# Patient Record
Sex: Female | Born: 1955 | Race: White | Hispanic: No | Smoking: Never smoker
Health system: Southern US, Community
[De-identification: ages and names within clinical notes are randomized; demographics above are authoritative.]

## PROBLEM LIST (undated history)

## (undated) DIAGNOSIS — Z923 Personal history of irradiation: Secondary | ICD-10-CM

## (undated) DIAGNOSIS — C801 Malignant (primary) neoplasm, unspecified: Secondary | ICD-10-CM

---

## 2007-06-12 ENCOUNTER — Ambulatory Visit: Payer: Self-pay | Admitting: Obstetrics and Gynecology

## 2007-06-12 ENCOUNTER — Encounter: Payer: Self-pay | Admitting: Obstetrics and Gynecology

## 2007-07-02 ENCOUNTER — Encounter: Admission: RE | Admit: 2007-07-02 | Discharge: 2007-07-02 | Payer: Self-pay | Admitting: Obstetrics and Gynecology

## 2007-09-25 HISTORY — PX: BUNIONECTOMY: SHX129

## 2010-02-02 ENCOUNTER — Ambulatory Visit: Payer: Self-pay | Admitting: Dermatology

## 2010-10-16 ENCOUNTER — Encounter: Payer: Self-pay | Admitting: Obstetrics and Gynecology

## 2011-02-06 NOTE — Group Therapy Note (Signed)
NAMEELISHEBA, MCDONNELL           ACCOUNT NO.:  [id number]   MEDICAL RECORD NO.:  [id number]          PATIENT TYPE:  WOC   LOCATION:  [facility]                   FACILITY:  [facility]   PHYSICIAN:  Argentina Donovan, MD        DATE OF BIRTH:  1956-06-06   DATE OF SERVICE:  06/12/2007                                  CLINIC NOTE   The patient is a 55 year old Caucasian female gravida 4, para 4-0-0-4 in  excellent health.   Blood pressure 108/70, on no medications with oligomenorrhea and no  complaints,  weighing 134 pounds.  In for a Pap smear and breast exam with BREASTS:  Symmetrical with no  dominant masses.  No nipple discharge.  The abdomen soft, nontender.  No  masses or organomegaly.  External genitalia is normal.  BUS is within normal limits.  Vagina is  clean and well rugated.  Cervix is clean and parous and the uterus and  adnexa are within normal limits.  RECTAL:  No masses.   IMPRESSION:  Normal gynecological examination.           ______________________________  Argentina Donovan, MD     PR/MEDQ  D:  06/12/2007  T:  06/12/2007  Job:  [id number]

## 2014-09-24 HISTORY — PX: OTHER SURGICAL HISTORY: SHX169

## 2015-10-18 ENCOUNTER — Encounter: Payer: Self-pay | Admitting: Obstetrics & Gynecology

## 2015-10-18 ENCOUNTER — Ambulatory Visit (INDEPENDENT_AMBULATORY_CARE_PROVIDER_SITE_OTHER): Payer: BLUE CROSS/BLUE SHIELD | Admitting: Obstetrics & Gynecology

## 2015-10-18 VITALS — BP 130/77 | HR 70 | Resp 20 | Ht 64.0 in | Wt 133.0 lb

## 2015-10-18 DIAGNOSIS — Z0000 Encounter for general adult medical examination without abnormal findings: Secondary | ICD-10-CM

## 2015-10-18 DIAGNOSIS — Z1151 Encounter for screening for human papillomavirus (HPV): Secondary | ICD-10-CM | POA: Diagnosis not present

## 2015-10-18 DIAGNOSIS — Z124 Encounter for screening for malignant neoplasm of cervix: Secondary | ICD-10-CM | POA: Diagnosis not present

## 2015-10-18 DIAGNOSIS — N950 Postmenopausal bleeding: Secondary | ICD-10-CM | POA: Diagnosis not present

## 2015-10-18 DIAGNOSIS — Z01419 Encounter for gynecological examination (general) (routine) without abnormal findings: Secondary | ICD-10-CM

## 2015-10-18 MED ORDER — MISOPROSTOL 200 MCG PO TABS: ORAL_TABLET | ORAL | Status: DC

## 2015-10-18 NOTE — Progress Notes (Signed)
Pt stopped having a period 3 years ago, started experiencing vaginal discharge and noticed spotting occasionally on and off.

## 2015-10-18 NOTE — Progress Notes (Signed)
Subjective:    Erin Warner is a 60 y.o. WWP 4 (15, 45, 41, and 37 yo kids, 2 grands) female who presents for an annual exam. She has been seeing some PMB for at least 3 years. She has never been on HRT. The patient is not currently sexually active. GYN screening history: last pap: was normal. The patient wears seatbelts: yes. The patient participates in regular exercise: yes. Has the patient ever been transfused or tattooed?: no. The patient reports that there is not domestic violence in her life.   Menstrual History: OB History    Gravida Para Term Preterm AB TAB SAB Ectopic Multiple Living   4 4 4       8       Menarche age: 2  No LMP recorded. Patient is postmenopausal.    The following portions of the patient's history were reviewed and updated as appropriate: allergies, current medications, past family history, past medical history, past social history, past surgical history and problem list.  Review of Systems Pertinent items noted in HPI and remainder of comprehensive ROS otherwise negative.  She works at [employer] (her son owns it). She declines a flu vaccine. She has not had a colonoscopy. Objective:    BP 130/77 mmHg  Pulse 70  Resp 20  Ht 5\' 4"  (1.626 m)  Wt 133 lb (60.328 kg)  BMI 22.82 kg/m2  General Appearance:    Alert, cooperative, no distress, appears stated age  Head:    Normocephalic, without obvious abnormality, atraumatic  Eyes:    PERRL, conjunctiva/corneas clear, EOM's intact, fundi    benign, both eyes  Ears:    Normal TM's and external ear canals, both ears  Nose:   Nares normal, septum midline, mucosa normal, no drainage    or sinus tenderness  Throat:   Lips, mucosa, and tongue normal; teeth and gums normal  Neck:   Supple, symmetrical, trachea midline, no adenopathy;    thyroid:  no enlargement/tenderness/nodules; no carotid   bruit or JVD  Back:     Symmetric, no curvature, ROM normal, no CVA tenderness  Lungs:     Clear to  auscultation bilaterally, respirations unlabored  Chest Wall:    No tenderness or deformity   Heart:    Regular rate and rhythm, S1 and S2 normal, no murmur, rub   or gallop  Breast Exam:    No tenderness, masses, or nipple abnormality  Abdomen:     Soft, non-tender, bowel sounds active all four quadrants,    no masses, no organomegaly  Genitalia:    Normal female without lesion, discharge or tenderness, atrophy, NSSR, NT, mobile, no adnexal masses     Extremities:   Extremities normal, atraumatic, no cyanosis or edema  Pulses:   2+ and symmetric all extremities  Skin:   Skin color, texture, turgor normal, no rashes or lesions  Lymph nodes:   Cervical, supraclavicular, and axillary nodes normal  Neurologic:   CNII-XII intact, normal strength, sensation and reflexes    throughout  .    Assessment:    Healthy female exam.   PMB   Plan:     Breast self exam technique reviewed and patient encouraged to perform self-exam monthly. Mammogram. Thin prep Pap smear. with cotesting Gyn u/s Refer to GI for colonoscopy

## 2015-10-19 LAB — CYTOLOGY - PAP

## 2015-10-31 ENCOUNTER — Ambulatory Visit
Admission: RE | Admit: 2015-10-31 | Discharge: 2015-10-31 | Disposition: A | Discharge status: Home / Self Care | Payer: BLUE CROSS/BLUE SHIELD | Source: Ambulatory Visit | Attending: Obstetrics & Gynecology | Admitting: Obstetrics & Gynecology

## 2015-10-31 DIAGNOSIS — Z0000 Encounter for general adult medical examination without abnormal findings: Secondary | ICD-10-CM

## 2015-10-31 DIAGNOSIS — Z1231 Encounter for screening mammogram for malignant neoplasm of breast: Secondary | ICD-10-CM | POA: Diagnosis not present

## 2015-11-01 ENCOUNTER — Encounter: Payer: Self-pay | Admitting: *Deleted

## 2015-11-01 ENCOUNTER — Other Ambulatory Visit (INDEPENDENT_AMBULATORY_CARE_PROVIDER_SITE_OTHER): Payer: BLUE CROSS/BLUE SHIELD | Admitting: Obstetrics & Gynecology

## 2015-11-01 VITALS — BP 128/80 | HR 69 | Resp 18 | Ht 64.0 in | Wt 133.0 lb

## 2015-11-01 DIAGNOSIS — Z0000 Encounter for general adult medical examination without abnormal findings: Secondary | ICD-10-CM

## 2015-11-01 DIAGNOSIS — Z01419 Encounter for gynecological examination (general) (routine) without abnormal findings: Secondary | ICD-10-CM

## 2015-11-01 DIAGNOSIS — N950 Postmenopausal bleeding: Secondary | ICD-10-CM | POA: Diagnosis not present

## 2015-11-01 LAB — CBC
HEMATOCRIT: 43 % (ref 36.0–46.0)
HEMOGLOBIN: 14.4 g/dL (ref 12.0–15.0)
MCH: 28.7 pg (ref 26.0–34.0)
MCHC: 33.5 g/dL (ref 30.0–36.0)
MCV: 85.8 fL (ref 78.0–100.0)
MPV: 9.1 fL (ref 8.6–12.4)
Platelets: 290 10*3/uL (ref 150–400)
RBC: 5.01 MIL/uL (ref 3.87–5.11)
RDW: 13.8 % (ref 11.5–15.5)
WBC: 6 10*3/uL (ref 4.0–10.5)

## 2015-11-01 NOTE — Progress Notes (Signed)
   Subjective:    Patient ID: Erin Warner, female    DOB: [birth year], 60 y.o.   MRN: [id number]  HPI 60 yo WW lady is here for endometrial biopsy for her PMB. Her u/s is scheduled for 11-03-15. She took cytotec last night. She will be getting fasting blood work today.   Review of Systems     Objective:   Physical Exam [id number]  UPT negative, consent signed, time out done Cervix prepped with betadine and grasped with a single tooth tenaculum Uterus sounded to 9 cm Pipelle used for 3 passes with a large amount of tissue obtained. She tolerated the procedure well.        Assessment & Plan:  PMB- await pathology and u/s results Preventative care- fasting labs

## 2015-11-01 NOTE — Addendum Note (Signed)
Addended by: Emily Filbert on: 11/01/2015 08:44 AM   Modules accepted: Level of Service

## 2015-11-01 NOTE — Addendum Note (Signed)
Addended by: Ricka Burdock on: 11/01/2015 08:52 AM   Modules accepted: Orders

## 2015-11-02 LAB — COMPREHENSIVE METABOLIC PANEL
ALBUMIN: 4 g/dL (ref 3.6–5.1)
ALT: 9 U/L (ref 6–29)
AST: 15 U/L (ref 10–35)
Alkaline Phosphatase: 89 U/L (ref 33–130)
BUN: 13 mg/dL (ref 7–25)
CALCIUM: 8.8 mg/dL (ref 8.6–10.4)
CHLORIDE: 106 mmol/L (ref 98–110)
CO2: 27 mmol/L (ref 20–31)
Creat: 0.91 mg/dL (ref 0.50–0.99)
Glucose, Bld: 92 mg/dL (ref 65–99)
POTASSIUM: 4.5 mmol/L (ref 3.5–5.3)
SODIUM: 142 mmol/L (ref 135–146)
TOTAL PROTEIN: 6.1 g/dL (ref 6.1–8.1)
Total Bilirubin: 0.5 mg/dL (ref 0.2–1.2)

## 2015-11-02 LAB — LIPID PANEL
CHOL/HDL RATIO: 4 ratio (ref <=5.0)
CHOLESTEROL: 206 mg/dL — AB (ref 125–200)
HDL: 52 mg/dL (ref >=46)
LDL CALC: 100 mg/dL (ref <130)
TRIGLYCERIDES: 270 mg/dL — AB (ref <150)
VLDL: 54 mg/dL — AB (ref <30)

## 2015-11-02 LAB — TSH: TSH: 0.93 mIU/L

## 2015-11-02 LAB — VITAMIN D 25 HYDROXY (VIT D DEFICIENCY, FRACTURES): Vit D, 25-Hydroxy: 15 ng/mL — ABNORMAL LOW (ref 30–100)

## 2015-11-03 ENCOUNTER — Ambulatory Visit (HOSPITAL_COMMUNITY)
Admission: RE | Admit: 2015-11-03 | Discharge: 2015-11-03 | Disposition: A | Discharge status: Home / Self Care | Payer: BLUE CROSS/BLUE SHIELD | Source: Ambulatory Visit | Attending: Obstetrics & Gynecology | Admitting: Obstetrics & Gynecology

## 2015-11-03 ENCOUNTER — Telehealth: Payer: Self-pay | Admitting: *Deleted

## 2015-11-03 ENCOUNTER — Other Ambulatory Visit: Payer: Self-pay | Admitting: Obstetrics & Gynecology

## 2015-11-03 DIAGNOSIS — Z0000 Encounter for general adult medical examination without abnormal findings: Secondary | ICD-10-CM

## 2015-11-03 DIAGNOSIS — R935 Abnormal findings on diagnostic imaging of other abdominal regions, including retroperitoneum: Secondary | ICD-10-CM | POA: Insufficient documentation

## 2015-11-03 DIAGNOSIS — N950 Postmenopausal bleeding: Secondary | ICD-10-CM | POA: Diagnosis present

## 2015-11-03 NOTE — Telephone Encounter (Signed)
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  Called report to Dr Hulan Fray, pt has [place] today, will have pt come in office tomorrow 11-04-15 to discuss results.

## 2015-11-04 ENCOUNTER — Ambulatory Visit (INDEPENDENT_AMBULATORY_CARE_PROVIDER_SITE_OTHER): Payer: BLUE CROSS/BLUE SHIELD | Admitting: Obstetrics & Gynecology

## 2015-11-04 DIAGNOSIS — C541 Malignant neoplasm of endometrium: Secondary | ICD-10-CM

## 2015-11-04 NOTE — Progress Notes (Signed)
   Subjective:    Patient ID: Erin Warner, female    DOB: 1956/03/12, 60 y.o.   MRN: [id number]  HPI She is here to discuss her EMBX   Review of Systems     Objective:   Physical Exam        Assessment & Plan:

## 2015-11-08 ENCOUNTER — Telehealth: Payer: Self-pay | Admitting: *Deleted

## 2015-11-08 DIAGNOSIS — R7989 Other specified abnormal findings of blood chemistry: Secondary | ICD-10-CM

## 2015-11-08 MED ORDER — VITAMIN D (ERGOCALCIFEROL) 1.25 MG (50000 UNIT) PO CAPS: 50000.0000 [IU] | ORAL_CAPSULE | ORAL | Status: DC

## 2015-11-08 NOTE — Telephone Encounter (Signed)
-----   Message from Emily Filbert, MD sent at 11/04/2015 11:04 AM EST ----- Vit D level is low She will need 50,000 Vit D weekly for 6 weeks, then re check

## 2015-11-09 ENCOUNTER — Telehealth: Payer: Self-pay | Admitting: *Deleted

## 2015-11-09 DIAGNOSIS — C541 Malignant neoplasm of endometrium: Secondary | ICD-10-CM

## 2015-11-09 NOTE — Telephone Encounter (Signed)
Received call from Radiology to edit CT order to CT abd/pelvis with contrast only. New order placed.

## 2015-11-10 ENCOUNTER — Ambulatory Visit (HOSPITAL_COMMUNITY)
Admission: RE | Admit: 2015-11-10 | Discharge: 2015-11-10 | Disposition: A | Discharge status: Home / Self Care | Payer: BLUE CROSS/BLUE SHIELD | Source: Ambulatory Visit | Attending: Obstetrics & Gynecology | Admitting: Obstetrics & Gynecology

## 2015-11-10 DIAGNOSIS — K769 Liver disease, unspecified: Secondary | ICD-10-CM | POA: Insufficient documentation

## 2015-11-10 DIAGNOSIS — C541 Malignant neoplasm of endometrium: Secondary | ICD-10-CM

## 2015-11-10 DIAGNOSIS — R918 Other nonspecific abnormal finding of lung field: Secondary | ICD-10-CM | POA: Diagnosis not present

## 2015-11-10 MED ORDER — IOHEXOL 300 MG/ML  SOLN
100.0000 mL | Freq: Once | INTRAMUSCULAR | Status: AC | PRN
  Administered 2015-11-10: 100 mL via INTRAVENOUS

## 2015-11-18 ENCOUNTER — Encounter: Payer: Self-pay | Admitting: Gynecology

## 2015-11-18 ENCOUNTER — Ambulatory Visit: Payer: BLUE CROSS/BLUE SHIELD | Attending: Gynecology | Admitting: Gynecology

## 2015-11-18 VITALS — BP 119/71 | HR 73 | Temp 98.0°F | Resp 18 | Ht 64.0 in | Wt 127.2 lb

## 2015-11-18 DIAGNOSIS — C541 Malignant neoplasm of endometrium: Secondary | ICD-10-CM | POA: Insufficient documentation

## 2015-11-18 NOTE — Patient Instructions (Signed)
Preparing for your Surgery  Plan for surgery on March 9 with Dr. [name].  You will be scheduled for a robotic assisted total hysterectomy, bilateral salpingo-oophorectomy, and sentinel lymph node biopsy.  Pre-operative Testing -You will receive a phone call from presurgical testing at [facility] to arrange for a pre-operative testing appointment before your surgery.  This appointment normally occurs one to two weeks before your scheduled surgery.   -Bring your insurance card, copy of an advanced directive if applicable, medication list  -At that visit, you will be asked to sign a consent for a possible blood transfusion in case a transfusion becomes necessary during surgery.  The need for a blood transfusion is rare but having consent is a necessary part of your care.     -You should not be taking blood thinners or aspirin at least ten days prior to surgery unless instructed by your surgeon.  Day Before Surgery at [facility] will be asked to take in only clear liquids the day before surgery.  Examples of clear liquids include broths, jello, and clear juices.  Avoid carbonated beverages.  You will be advised to have nothing to eat or drink after midnight the evening before.    Your role in recovery Your role is to become active as soon as directed by your doctor, while still giving yourself time to heal.  Rest when you feel tired. You will be asked to do the following in order to speed your recovery:  - Cough and breathe deeply. This helps toclear and expand your lungs and can prevent pneumonia. You may be given a spirometer to practice deep breathing. A staff member will show you how to use the spirometer. - Do mild physical activity. Walking or moving your legs help your circulation and body functions return to normal. A staff member will help you when you try to walk and will provide you with simple exercises. Do not try to get up or walk alone the first time. -  Actively manage your pain. Managing your pain lets you move in comfort. We will ask you to rate your pain on a scale of zero to 10. It is your responsibility to tell your doctor or nurse where and how much you hurt so your pain can be treated.  Special Considerations -If you are diabetic, you may be placed on insulin after surgery to have closer control over your blood sugars to promote healing and recovery.  This does not mean that you will be discharged on insulin.  If applicable, your oral antidiabetics will be resumed when you are tolerating a solid diet.  -Your final pathology results from surgery should be available by the Friday after surgery and the results will be relayed to you when available.  Blood Transfusion Information WHAT IS A BLOOD TRANSFUSION? A transfusion is the replacement of blood or some of its parts. Blood is made up of multiple cells which provide different functions.  Red blood cells carry oxygen and are used for blood loss replacement.  White blood cells fight against infection.  Platelets control bleeding.  Plasma helps clot blood.  Other blood products are available for specialized needs, such as hemophilia or other clotting disorders. BEFORE THE TRANSFUSION  Who gives blood for transfusions?   You may be able to donate blood to be used at a later date on yourself (autologous donation).  Relatives can be asked to donate blood. This is generally not any safer than if you have received blood  from a stranger. The same precautions are taken to ensure safety when a relative's blood is donated.  Healthy volunteers who are fully evaluated to make sure their blood is safe. This is blood bank blood. Transfusion therapy is the safest it has ever been in the practice of medicine. Before blood is taken from a donor, a complete history is taken to make sure that person has no history of diseases nor engages in risky social behavior (examples are intravenous drug use or  sexual activity with multiple partners). The donor's travel history is screened to minimize risk of transmitting infections, such as malaria. The donated blood is tested for signs of infectious diseases, such as HIV and hepatitis. The blood is then tested to be sure it is compatible with you in order to minimize the chance of a transfusion reaction. If you or a relative donates blood, this is often done in anticipation of surgery and is not appropriate for emergency situations. It takes many days to process the donated blood. RISKS AND COMPLICATIONS Although transfusion therapy is very safe and saves many lives, the main dangers of transfusion include:   Getting an infectious disease.  Developing a transfusion reaction. This is an allergic reaction to something in the blood you were given. Every precaution is taken to prevent this. The decision to have a blood transfusion has been considered carefully by your caregiver before blood is given. Blood is not given unless the benefits outweigh the risks.

## 2015-11-18 NOTE — Progress Notes (Signed)
Consult Note: Gyn-Onc   Erin Warner 60 y.o. female  Chief Complaint  Patient presents with  . 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  Plan:s the primary surgeon.I recommend that the patient undergo robotically assisted hysterectomy bilateral salpingo-oophorectomy with planned intraoperative frozen section to assess tumor size, depth of invasion and grade. Pelvic and aortic lymphadenectomy may be performed should high-risk features be identified.. V Surgical procedure, risks and benefits were reviewed with the patient and her daughter. Potential complications including hemorrhage infection, injury to adjacent viscera, anesthetic risks, and thromboembolic comp locations were reviewed. All questions are answered. Surgery will be scheduled at [facility] with Dr. [name] as the primary surgeon. 12/01/2015.     HPI:60 year old white female seen in consultation request of Dr. Hulan Fray regarding management of a newly diagnosed endometrial carcinoma. The patient presented with several months of abnormal postmenopausal bleeding. Ultrasound showed a thickened endometrium the patient subsequent underwent an endometrial biopsy that showed a grade 1 adenocarcinoma with squamous differentiation. CT scan of the abdomen and pelvis shows liver cysts and no other adenopathy or any evidence of advanced disease. She denies any other pelvic pain pressure or any GI or GU symptoms.  She denies any family history of gynecologic malignancies. She is up-to-date with mammograms.  Review of Systems:10 point review of systems is negative except as noted in interval history.   Vitals: Blood pressure 119/71, pulse 73, temperature 98 F (36.7 C), temperature source Oral, resp. rate 18, height 5\' 4"  (1.626 m), weight 127 lb 3.2 oz (57.698 kg), SpO2 97 %.  Physical Exam: General : The patient is a healthy woman in no acute distress.  HEENT:  normocephalic, extraoccular movements normal; neck is supple without thyromegally  Lynphnodes: Supraclavicular and inguinal nodes not enlarged  Abdomen: Soft, non-tender, no ascites, no organomegally, no masses, no hernias  Pelvic:  EGBUS: Normal female  Vagina: Normal, no lesions  Urethra and Bladder: Normal, non-tender  Cervix: Normal although she does have some prolapse.  Uterus: Normal shape size and consistency. Bi-manual examination: Non-tender; no adenxal masses or nodularity  Rectal: normal sphincter tone, no masses, no blood  Lower extremities: No edema or varicosities. Normal range of motion      No Known Allergies  History reviewed. No pertinent past medical history.  Past Surgical History  Procedure Laterality Date  . Bunionectomy  2009  . Basal cell removal  2016    Current Outpatient Prescriptions  Medication Sig Dispense Refill  . Multiple Vitamin (MULTIVITAMIN) tablet Take 1 tablet by mouth daily.    . Vitamin D, Ergocalciferol, (DRISDOL) 50000 units CAPS capsule Take 1 capsule (50,000 Units total) by mouth every 7 (seven) days. For 6 weeks. 6 capsule 0  . acetaminophen (TYLENOL) 500 MG tablet Take 500 mg by mouth every 6 (six) hours as needed. Reported on 11/18/2015     No current facility-administered medications for this visit.    Social History   Social History  . Marital Status: Widowed    Spouse Name: N/A  . Number of Children: N/A  . Years of Education: N/A   Occupational History  . Not on file.   Social History Main Topics  . Smoking status: Never Smoker   . Smokeless tobacco: Not on file  . Alcohol Use: No  . Drug Use: No  . Sexual Activity: Not Currently    Birth Control/ Protection: Post-menopausal   Other Topics Concern  .  Not on file   Social History Narrative    Family History  Problem Relation Age of Onset  . Cancer Mother 47    Lung  . Heart disease Father       Alvino Chapel, MD 11/18/2015, 2:13  PM

## 2015-11-28 ENCOUNTER — Encounter (HOSPITAL_COMMUNITY): Payer: Self-pay

## 2015-11-28 ENCOUNTER — Ambulatory Visit (HOSPITAL_COMMUNITY)
Admission: RE | Admit: 2015-11-28 | Discharge: 2015-11-28 | Disposition: A | Discharge status: Home / Self Care | Payer: BLUE CROSS/BLUE SHIELD | Source: Ambulatory Visit | Attending: Gynecologic Oncology | Admitting: Gynecologic Oncology

## 2015-11-28 ENCOUNTER — Encounter (HOSPITAL_COMMUNITY)
Admission: RE | Admit: 2015-11-28 | Discharge: 2015-11-28 | Disposition: A | Discharge status: Home / Self Care | Payer: BLUE CROSS/BLUE SHIELD | Source: Ambulatory Visit | Attending: Gynecologic Oncology | Admitting: Gynecologic Oncology

## 2015-11-28 DIAGNOSIS — C541 Malignant neoplasm of endometrium: Secondary | ICD-10-CM

## 2015-11-28 HISTORY — DX: Malignant (primary) neoplasm, unspecified: C80.1

## 2015-11-28 LAB — CBC WITH DIFFERENTIAL/PLATELET
BASOS ABS: 0 10*3/uL (ref 0.0–0.1)
Basophils Relative: 1 %
EOS PCT: 3 %
Eosinophils Absolute: 0.2 10*3/uL (ref 0.0–0.7)
HCT: 42 % (ref 36.0–46.0)
HEMOGLOBIN: 14.1 g/dL (ref 12.0–15.0)
LYMPHS ABS: 1.4 10*3/uL (ref 0.7–4.0)
LYMPHS PCT: 25 %
MCH: 28.9 pg (ref 26.0–34.0)
MCHC: 33.6 g/dL (ref 30.0–36.0)
MCV: 86.1 fL (ref 78.0–100.0)
Monocytes Absolute: 0.3 10*3/uL (ref 0.1–1.0)
Monocytes Relative: 6 %
NEUTROS PCT: 65 %
Neutro Abs: 3.7 10*3/uL (ref 1.7–7.7)
PLATELETS: 310 10*3/uL (ref 150–400)
RBC: 4.88 MIL/uL (ref 3.87–5.11)
RDW: 12.8 % (ref 11.5–15.5)
WBC: 5.7 10*3/uL (ref 4.0–10.5)

## 2015-11-28 LAB — COMPREHENSIVE METABOLIC PANEL
ALK PHOS: 84 U/L (ref 38–126)
ALT: 15 U/L (ref 14–54)
AST: 20 U/L (ref 15–41)
Albumin: 4.4 g/dL (ref 3.5–5.0)
Anion gap: 7 (ref 5–15)
BUN: 17 mg/dL (ref 6–20)
CALCIUM: 9.3 mg/dL (ref 8.9–10.3)
CHLORIDE: 110 mmol/L (ref 101–111)
CO2: 29 mmol/L (ref 22–32)
CREATININE: 0.79 mg/dL (ref 0.44–1.00)
GFR calc Af Amer: >60 mL/min (ref >60)
Glucose, Bld: 108 mg/dL — ABNORMAL HIGH (ref 65–99)
Potassium: 5 mmol/L (ref 3.5–5.1)
SODIUM: 146 mmol/L — AB (ref 135–145)
Total Bilirubin: 0.8 mg/dL (ref 0.3–1.2)
Total Protein: 6.8 g/dL (ref 6.5–8.1)

## 2015-11-28 LAB — URINALYSIS, ROUTINE W REFLEX MICROSCOPIC
Bilirubin Urine: NEGATIVE
Glucose, UA: NEGATIVE mg/dL
Hgb urine dipstick: NEGATIVE
Ketones, ur: NEGATIVE mg/dL
LEUKOCYTES UA: NEGATIVE
NITRITE: NEGATIVE
PH: 6 (ref 5.0–8.0)
Protein, ur: NEGATIVE mg/dL
SPECIFIC GRAVITY, URINE: 1.023 (ref 1.005–1.030)

## 2015-11-28 NOTE — Patient Instructions (Signed)
Erin Warner  11/28/2015   Your procedure is scheduled on: 12-01-15  Report to [facility] Main  Entrance take [facility]  elevators to 3rd floor to  [place] at   11001AM.  Call this number if you have problems the morning of surgery [phone]   Remember: ONLY 1 PERSON MAY GO WITH YOU TO SHORT STAY TO GET  READY MORNING OF YOUR SURGERY.  Do not eat food or drink liquids :After Midnight. (Follow  preop instructions per office).     Take these medicines the morning of surgery with A SIP OF WATER: None. DO NOT TAKE ANY DIABETIC MEDICATIONS DAY OF YOUR SURGERY                               You may not have any metal on your body including hair pins and              piercings  Do not wear jewelry, make-up, lotions, powders or perfumes, deodorant             Do not wear nail polish.  Do not shave  48 hours prior to surgery.              Men may shave face and neck.   Do not bring valuables to the hospital. [place].  Contacts, dentures or bridgework may not be worn into surgery.  Leave suitcase in the car. After surgery it may be brought to your room.     Patients discharged the day of surgery will not be allowed to drive home.  Name and phone number of your driver: Verdis Frederickson -daughter [phone] cell  Special Instructions: N/A              Please read over the following fact sheets you were given: _____________________________________________________________________             [facility] - Preparing for Surgery Before surgery, you can play an important role.  Because skin is not sterile, your skin needs to be as free of germs as possible.  You can reduce the number of germs on your skin by washing with CHG (chlorahexidine gluconate) soap before surgery.  CHG is an antiseptic cleaner which kills germs and bonds with the skin to continue killing germs even after washing. Please DO NOT use if you have  an allergy to CHG or antibacterial soaps.  If your skin becomes reddened/irritated stop using the CHG and inform your nurse when you arrive at Short Stay. Do not shave (including legs and underarms) for at least 48 hours prior to the first CHG shower.  You may shave your face/neck. Please follow these instructions carefully:  1.  Shower with CHG Soap the night before surgery and the  morning of Surgery.  2.  If you choose to wash your hair, wash your hair first as usual with your  normal  shampoo.  3.  After you shampoo, rinse your hair and body thoroughly to remove the  shampoo.                           4.  Use CHG as you would any other liquid soap.  You can  apply chg directly  to the skin and wash                       Gently with a scrungie or clean washcloth.  5.  Apply the CHG Soap to your body ONLY FROM THE NECK DOWN.   Do not use on face/ open                           Wound or open sores. Avoid contact with eyes, ears mouth and genitals (private parts).                       Wash face,  Genitals (private parts) with your normal soap.             6.  Wash thoroughly, paying special attention to the area where your surgery  will be performed.  7.  Thoroughly rinse your body with warm water from the neck down.  8.  DO NOT shower/wash with your normal soap after using and rinsing off  the CHG Soap.                9.  Pat yourself dry with a clean towel.            10.  Wear clean pajamas.            11.  Place clean sheets on your bed the night of your first shower and do not  sleep with pets. Day of Surgery : Do not apply any lotions/deodorants the morning of surgery.  Please wear clean clothes to the hospital/surgery center.  FAILURE TO FOLLOW THESE INSTRUCTIONS MAY RESULT IN THE CANCELLATION OF YOUR SURGERY PATIENT SIGNATURE_________________________________  NURSE  SIGNATURE__________________________________  ________________________________________________________________________  WHAT IS A BLOOD TRANSFUSION? Blood Transfusion Information  A transfusion is the replacement of blood or some of its parts. Blood is made up of multiple cells which provide different functions.  Red blood cells carry oxygen and are used for blood loss replacement.  White blood cells fight against infection.  Platelets control bleeding.  Plasma helps clot blood.  Other blood products are available for specialized needs, such as hemophilia or other clotting disorders. BEFORE THE TRANSFUSION  Who gives blood for transfusions?   Healthy volunteers who are fully evaluated to make sure their blood is safe. This is blood bank blood. Transfusion therapy is the safest it has ever been in the practice of medicine. Before blood is taken from a donor, a complete history is taken to make sure that person has no history of diseases nor engages in risky social behavior (examples are intravenous drug use or sexual activity with multiple partners). The donor's travel history is screened to minimize risk of transmitting infections, such as malaria. The donated blood is tested for signs of infectious diseases, such as HIV and hepatitis. The blood is then tested to be sure it is compatible with you in order to minimize the chance of a transfusion reaction. If you or a relative donates blood, this is often done in anticipation of surgery and is not appropriate for emergency situations. It takes many days to process the donated blood. RISKS AND COMPLICATIONS Although transfusion therapy is very safe and saves many lives, the main dangers of transfusion include:  1. Getting an infectious disease. 2. Developing a transfusion reaction. This is an allergic reaction to something in the blood you were given. Every precaution  is taken to prevent this. The decision to have a blood transfusion has been  considered carefully by your caregiver before blood is given. Blood is not given unless the benefits outweigh the risks. AFTER THE TRANSFUSION  Right after receiving a blood transfusion, you will usually feel much better and more energetic. This is especially true if your red blood cells have gotten low (anemic). The transfusion raises the level of the red blood cells which carry oxygen, and this usually causes an energy increase.  The nurse administering the transfusion will monitor you carefully for complications. HOME CARE INSTRUCTIONS  No special instructions are needed after a transfusion. You may find your energy is better. Speak with your caregiver about any limitations on activity for underlying diseases you may have. SEEK MEDICAL CARE IF:   Your condition is not improving after your transfusion.  You develop redness or irritation at the intravenous (IV) site. SEEK IMMEDIATE MEDICAL CARE IF:  Any of the following symptoms occur over the next 12 hours:  Shaking chills.  You have a temperature by mouth above 102 F (38.9 C), not controlled by medicine.  Chest, back, or muscle pain.  People around you feel you are not acting correctly or are confused.  Shortness of breath or difficulty breathing.  Dizziness and fainting.  You get a rash or develop hives.  You have a decrease in urine output.  Your urine turns a dark color or changes to pink, red, or brown. Any of the following symptoms occur over the next 10 days:  You have a temperature by mouth above 102 F (38.9 C), not controlled by medicine.  Shortness of breath.  Weakness after normal activity.  The white part of the eye turns yellow (jaundice).  You have a decrease in the amount of urine or are urinating less often.  Your urine turns a dark color or changes to pink, red, or brown. Document Released: 09/07/2000 Document Revised: 12/03/2011 Document Reviewed: 04/26/2008 ExitCare Patient Information 2014  [place].  _______________________________________________________________________  Incentive Spirometer  An incentive spirometer is a tool that can help keep your lungs clear and active. This tool measures how well you are filling your lungs with each breath. Taking long deep breaths may help reverse or decrease the chance of developing breathing (pulmonary) problems (especially infection) following:  A long period of time when you are unable to move or be active. BEFORE THE PROCEDURE   If the spirometer includes an indicator to show your best effort, your nurse or respiratory therapist will set it to a desired goal.  If possible, sit up straight or lean slightly forward. Try not to slouch.  Hold the incentive spirometer in an upright position. INSTRUCTIONS FOR USE  3. Sit on the edge of your bed if possible, or sit up as far as you can in bed or on a chair. 4. Hold the incentive spirometer in an upright position. 5. Breathe out normally. 6. Place the mouthpiece in your mouth and seal your lips tightly around it. 7. Breathe in slowly and as deeply as possible, raising the piston or the ball toward the top of the column. 8. Hold your breath for 3-5 seconds or for as long as possible. Allow the piston or ball to fall to the bottom of the column. 9. Remove the mouthpiece from your mouth and breathe out normally. 10. Rest for a few seconds and repeat Steps 1 through 7 at least 10 times every 1-2 hours when you are awake. Take  your time and take a few normal breaths between deep breaths. 11. The spirometer may include an indicator to show your best effort. Use the indicator as a goal to work toward during each repetition. 12. After each set of 10 deep breaths, practice coughing to be sure your lungs are clear. If you have an incision (the cut made at the time of surgery), support your incision when coughing by placing a pillow or rolled up towels firmly against it. Once you are able to  get out of bed, walk around indoors and cough well. You may stop using the incentive spirometer when instructed by your caregiver.  RISKS AND COMPLICATIONS  Take your time so you do not get dizzy or light-headed.  If you are in pain, you may need to take or ask for pain medication before doing incentive spirometry. It is harder to take a deep breath if you are having pain. AFTER USE  Rest and breathe slowly and easily.  It can be helpful to keep track of a log of your progress. Your caregiver can provide you with a simple table to help with this. If you are using the spirometer at home, follow these instructions: [place] IF:   You are having difficultly using the spirometer.  You have trouble using the spirometer as often as instructed.  Your pain medication is not giving enough relief while using the spirometer.  You develop fever of 100.5 F (38.1 C) or higher. SEEK IMMEDIATE MEDICAL CARE IF:   You cough up bloody sputum that had not been present before.  You develop fever of 102 F (38.9 C) or greater.  You develop worsening pain at or near the incision site. MAKE SURE YOU:   Understand these instructions.  Will watch your condition.  Will get help right away if you are not doing well or get worse. Document Released: 01/21/2007 Document Revised: 12/03/2011 Document Reviewed: 03/24/2007 [facility] Patient Information 2014 [place], [place].   ________________________________________________________________________

## 2015-11-28 NOTE — Pre-Procedure Instructions (Addendum)
11-28-15 CXR done per MD order. T/S done today per Earlene Plater.

## 2015-11-29 ENCOUNTER — Other Ambulatory Visit (HOSPITAL_COMMUNITY): Payer: BLUE CROSS/BLUE SHIELD

## 2015-11-29 LAB — ABO/RH: ABO/RH(D): O POS

## 2015-12-01 ENCOUNTER — Ambulatory Visit (HOSPITAL_COMMUNITY): Payer: BLUE CROSS/BLUE SHIELD | Admitting: Certified Registered"

## 2015-12-01 ENCOUNTER — Ambulatory Visit (HOSPITAL_COMMUNITY)
Admission: RE | Admit: 2015-12-01 | Discharge: 2015-12-02 | Disposition: A | Discharge status: Home / Self Care | Payer: BLUE CROSS/BLUE SHIELD | Source: Ambulatory Visit | Attending: Gynecologic Oncology | Admitting: Gynecologic Oncology

## 2015-12-01 ENCOUNTER — Encounter (HOSPITAL_COMMUNITY)
Admission: RE | Disposition: A | Discharge status: Home / Self Care | Payer: Self-pay | Source: Ambulatory Visit | Attending: Gynecologic Oncology

## 2015-12-01 ENCOUNTER — Encounter (HOSPITAL_COMMUNITY): Payer: Self-pay | Admitting: *Deleted

## 2015-12-01 DIAGNOSIS — D271 Benign neoplasm of left ovary: Secondary | ICD-10-CM | POA: Insufficient documentation

## 2015-12-01 DIAGNOSIS — C541 Malignant neoplasm of endometrium: Secondary | ICD-10-CM | POA: Diagnosis not present

## 2015-12-01 HISTORY — PX: ROBOTIC ASSISTED TOTAL HYSTERECTOMY: SHX6085

## 2015-12-01 LAB — TYPE AND SCREEN
ABO/RH(D): O POS
Antibody Screen: NEGATIVE

## 2015-12-01 SURGERY — HYSTERECTOMY, TOTAL, ROBOT-ASSISTED
Anesthesia: General

## 2015-12-01 MED ORDER — DEXAMETHASONE SODIUM PHOSPHATE 10 MG/ML IJ SOLN
INTRAMUSCULAR | Status: DC | PRN
  Administered 2015-12-01: 5 mg via INTRAVENOUS

## 2015-12-01 MED ORDER — PROPOFOL 10 MG/ML IV BOLUS
INTRAVENOUS | Status: DC | PRN
  Administered 2015-12-01: 160 mg via INTRAVENOUS

## 2015-12-01 MED ORDER — MIDAZOLAM HCL 5 MG/5ML IJ SOLN
INTRAMUSCULAR | Status: DC | PRN
  Administered 2015-12-01: 2 mg via INTRAVENOUS

## 2015-12-01 MED ORDER — FENTANYL CITRATE (PF) 250 MCG/5ML IJ SOLN
INTRAMUSCULAR | Status: AC
  Filled 2015-12-01: qty 5

## 2015-12-01 MED ORDER — HYDROMORPHONE HCL 1 MG/ML IJ SOLN: 0.2000 mg | INTRAMUSCULAR | Status: DC | PRN

## 2015-12-01 MED ORDER — SUGAMMADEX SODIUM 200 MG/2ML IV SOLN
INTRAVENOUS | Status: DC | PRN
  Administered 2015-12-01: 125 mg via INTRAVENOUS

## 2015-12-01 MED ORDER — OXYCODONE-ACETAMINOPHEN 5-325 MG PO TABS: 1.0000 | ORAL_TABLET | ORAL | Status: DC | PRN

## 2015-12-01 MED ORDER — GABAPENTIN 600 MG PO TABS
600.0000 mg | ORAL_TABLET | Freq: Every day | ORAL | Status: DC
  Filled 2015-12-01: qty 1

## 2015-12-01 MED ORDER — HYDROMORPHONE HCL 1 MG/ML IJ SOLN
0.2500 mg | INTRAMUSCULAR | Status: DC | PRN
  Administered 2015-12-01 (×4): 0.5 mg via INTRAVENOUS

## 2015-12-01 MED ORDER — LIDOCAINE HCL (CARDIAC) 20 MG/ML IV SOLN
INTRAVENOUS | Status: AC
  Filled 2015-12-01: qty 5

## 2015-12-01 MED ORDER — HYDROMORPHONE HCL 1 MG/ML IJ SOLN
INTRAMUSCULAR | Status: AC
  Filled 2015-12-01: qty 1

## 2015-12-01 MED ORDER — IBUPROFEN 800 MG PO TABS: 800.0000 mg | ORAL_TABLET | Freq: Three times a day (TID) | ORAL | Status: DC | PRN

## 2015-12-01 MED ORDER — FENTANYL CITRATE (PF) 100 MCG/2ML IJ SOLN
INTRAMUSCULAR | Status: DC | PRN
  Administered 2015-12-01: 50 ug via INTRAVENOUS
  Administered 2015-12-01: 100 ug via INTRAVENOUS
  Administered 2015-12-01 (×2): 50 ug via INTRAVENOUS

## 2015-12-01 MED ORDER — CEFAZOLIN SODIUM-DEXTROSE 2-3 GM-% IV SOLR
2.0000 g | INTRAVENOUS | Status: AC
  Administered 2015-12-01: 2 g via INTRAVENOUS

## 2015-12-01 MED ORDER — KETOROLAC TROMETHAMINE 15 MG/ML IJ SOLN
INTRAMUSCULAR | Status: AC
  Filled 2015-12-01: qty 1

## 2015-12-01 MED ORDER — LIDOCAINE HCL (CARDIAC) 20 MG/ML IV SOLN
INTRAVENOUS | Status: DC | PRN
  Administered 2015-12-01: 60 mg via INTRAVENOUS

## 2015-12-01 MED ORDER — SUGAMMADEX SODIUM 200 MG/2ML IV SOLN
INTRAVENOUS | Status: AC
  Filled 2015-12-01: qty 2

## 2015-12-01 MED ORDER — KETOROLAC TROMETHAMINE 15 MG/ML IJ SOLN
15.0000 mg | Freq: Four times a day (QID) | INTRAMUSCULAR | Status: DC
  Administered 2015-12-01 – 2015-12-02 (×3): 15 mg via INTRAVENOUS
  Filled 2015-12-01 (×3): qty 1

## 2015-12-01 MED ORDER — PROPOFOL 10 MG/ML IV BOLUS
INTRAVENOUS | Status: AC
  Filled 2015-12-01: qty 20

## 2015-12-01 MED ORDER — STERILE WATER FOR INJECTION IJ SOLN
INTRAMUSCULAR | Status: AC
  Filled 2015-12-01: qty 10

## 2015-12-01 MED ORDER — ONDANSETRON HCL 4 MG/2ML IJ SOLN: 4.0000 mg | Freq: Four times a day (QID) | INTRAMUSCULAR | Status: DC | PRN

## 2015-12-01 MED ORDER — ENOXAPARIN SODIUM 40 MG/0.4ML ~~LOC~~ SOLN
40.0000 mg | SUBCUTANEOUS | Status: DC
  Administered 2015-12-02: 40 mg via SUBCUTANEOUS
  Filled 2015-12-01 (×2): qty 0.4

## 2015-12-01 MED ORDER — MIDAZOLAM HCL 2 MG/2ML IJ SOLN
INTRAMUSCULAR | Status: AC
  Filled 2015-12-01: qty 2

## 2015-12-01 MED ORDER — LACTATED RINGERS IV SOLN
INTRAVENOUS | Status: DC | PRN
  Administered 2015-12-01: 1000 mL

## 2015-12-01 MED ORDER — ROCURONIUM BROMIDE 100 MG/10ML IV SOLN
INTRAVENOUS | Status: DC | PRN
  Administered 2015-12-01: 50 mg via INTRAVENOUS
  Administered 2015-12-01: 20 mg via INTRAVENOUS

## 2015-12-01 MED ORDER — ONDANSETRON HCL 4 MG/2ML IJ SOLN
INTRAMUSCULAR | Status: AC
  Filled 2015-12-01: qty 4

## 2015-12-01 MED ORDER — LIP MEDEX EX OINT
TOPICAL_OINTMENT | CUTANEOUS | Status: AC
  Filled 2015-12-01: qty 7

## 2015-12-01 MED ORDER — LACTATED RINGERS IV SOLN
INTRAVENOUS | Status: DC | PRN
  Administered 2015-12-01 (×2): via INTRAVENOUS

## 2015-12-01 MED ORDER — KCL IN DEXTROSE-NACL 20-5-0.45 MEQ/L-%-% IV SOLN
INTRAVENOUS | Status: DC
  Administered 2015-12-01: 19:00:00 via INTRAVENOUS
  Filled 2015-12-01 (×2): qty 1000

## 2015-12-01 MED ORDER — SUCCINYLCHOLINE CHLORIDE 20 MG/ML IJ SOLN
INTRAMUSCULAR | Status: DC | PRN
  Administered 2015-12-01: 100 mg via INTRAVENOUS

## 2015-12-01 MED ORDER — ENOXAPARIN SODIUM 40 MG/0.4ML ~~LOC~~ SOLN
40.0000 mg | SUBCUTANEOUS | Status: AC
  Administered 2015-12-01: 40 mg via SUBCUTANEOUS
  Filled 2015-12-01: qty 0.4

## 2015-12-01 MED ORDER — DEXAMETHASONE SODIUM PHOSPHATE 10 MG/ML IJ SOLN
INTRAMUSCULAR | Status: AC
  Filled 2015-12-01: qty 1

## 2015-12-01 MED ORDER — ONDANSETRON HCL 4 MG PO TABS: 4.0000 mg | ORAL_TABLET | Freq: Four times a day (QID) | ORAL | Status: DC | PRN

## 2015-12-01 MED ORDER — PROMETHAZINE HCL 25 MG/ML IJ SOLN: 6.2500 mg | INTRAMUSCULAR | Status: DC | PRN

## 2015-12-01 MED ORDER — CEFAZOLIN SODIUM-DEXTROSE 2-3 GM-% IV SOLR
INTRAVENOUS | Status: AC
  Filled 2015-12-01: qty 50

## 2015-12-01 MED ORDER — GABAPENTIN 300 MG PO CAPS
600.0000 mg | ORAL_CAPSULE | Freq: Every day | ORAL | Status: DC
  Administered 2015-12-01: 600 mg via ORAL
  Filled 2015-12-01 (×2): qty 2

## 2015-12-01 MED ORDER — ONDANSETRON HCL 4 MG/2ML IJ SOLN
INTRAMUSCULAR | Status: DC | PRN
  Administered 2015-12-01 (×4): 2 mg via INTRAVENOUS

## 2015-12-01 MED ORDER — STERILE WATER FOR IRRIGATION IR SOLN
Status: DC | PRN
  Administered 2015-12-01: 1000 mL

## 2015-12-01 SURGICAL SUPPLY — 50 items
APPLICATOR SURGIFLO ENDO (HEMOSTASIS) IMPLANT
CHLORAPREP W/TINT 26ML (MISCELLANEOUS) ×2 IMPLANT
COVER SURGICAL LIGHT HANDLE (MISCELLANEOUS) ×2 IMPLANT
COVER TIP SHEARS 8 DVNC (MISCELLANEOUS) ×1 IMPLANT
COVER TIP SHEARS 8MM DA VINCI (MISCELLANEOUS) ×1
DRAPE ARM DVNC X/XI (DISPOSABLE) ×4 IMPLANT
DRAPE COLUMN DVNC XI (DISPOSABLE) ×1 IMPLANT
DRAPE DA VINCI XI ARM (DISPOSABLE) ×4
DRAPE DA VINCI XI COLUMN (DISPOSABLE) ×1
DRAPE SHEET LG 3/4 BI-LAMINATE (DRAPES) ×4 IMPLANT
DRAPE SURG IRRIG POUCH 19X23 (DRAPES) ×2 IMPLANT
ELECT REM PT RETURN 9FT ADLT (ELECTROSURGICAL) ×2
ELECTRODE REM PT RTRN 9FT ADLT (ELECTROSURGICAL) ×1 IMPLANT
GLOVE BIO SURGEON STRL SZ 6 (GLOVE) ×8 IMPLANT
GLOVE BIO SURGEON STRL SZ 6.5 (GLOVE) ×4 IMPLANT
GOWN STRL REUS W/ TWL LRG LVL3 (GOWN DISPOSABLE) ×2 IMPLANT
GOWN STRL REUS W/TWL LRG LVL3 (GOWN DISPOSABLE) ×2
HOLDER FOLEY CATH W/STRAP (MISCELLANEOUS) ×2 IMPLANT
KIT BASIN OR (CUSTOM PROCEDURE TRAY) ×2 IMPLANT
KIT PROCEDURE DA VINCI SI (MISCELLANEOUS) ×1
KIT PROCEDURE DVNC SI (MISCELLANEOUS) ×1 IMPLANT
LIQUID BAND (GAUZE/BANDAGES/DRESSINGS) ×2 IMPLANT
MANIPULATOR UTERINE 4.5 ZUMI (MISCELLANEOUS) ×2 IMPLANT
MARKER SKIN DUAL TIP RULER LAB (MISCELLANEOUS) ×2 IMPLANT
OBTURATOR XI 8MM BLADELESS (TROCAR) ×2 IMPLANT
OCCLUDER COLPOPNEUMO (BALLOONS) ×2 IMPLANT
PAD POSITIONING PINK XL (MISCELLANEOUS) ×2 IMPLANT
PORT ACCESS TROCAR AIRSEAL 12 (TROCAR) ×1 IMPLANT
PORT ACCESS TROCAR AIRSEAL 5M (TROCAR) ×1
POUCH ENDO CATCH II 15MM (MISCELLANEOUS) IMPLANT
POUCH SPECIMEN RETRIEVAL 10MM (ENDOMECHANICALS) IMPLANT
SEAL CANN UNIV 5-8 DVNC XI (MISCELLANEOUS) ×4 IMPLANT
SEAL XI 5MM-8MM UNIVERSAL (MISCELLANEOUS) ×4
SET TRI-LUMEN FLTR TB AIRSEAL (TUBING) ×2 IMPLANT
SET TUBE IRRIG SUCTION NO TIP (IRRIGATION / IRRIGATOR) ×2 IMPLANT
SHEET LAVH (DRAPES) ×2 IMPLANT
SOLUTION ELECTROLUBE (MISCELLANEOUS) ×2 IMPLANT
SURGIFLO W/THROMBIN 8M KIT (HEMOSTASIS) IMPLANT
SUT MNCRL AB 4-0 PS2 18 (SUTURE) ×4 IMPLANT
SUT VIC AB 0 CT1 27 (SUTURE) ×1
SUT VIC AB 0 CT1 27XBRD ANTBC (SUTURE) ×1 IMPLANT
SUT VICRYL 0 UR6 27IN ABS (SUTURE) ×2 IMPLANT
SYR 50ML LL SCALE MARK (SYRINGE) ×2 IMPLANT
TOWEL OR 17X26 10 PK STRL BLUE (TOWEL DISPOSABLE) ×2 IMPLANT
TOWEL OR NON WOVEN STRL DISP B (DISPOSABLE) ×2 IMPLANT
TRAP SPECIMEN MUCOUS 40CC (MISCELLANEOUS) IMPLANT
TRAY FOLEY W/METER SILVER 14FR (SET/KITS/TRAYS/PACK) ×2 IMPLANT
TRAY LAPAROSCOPIC (CUSTOM PROCEDURE TRAY) ×2 IMPLANT
TROCAR BLADELESS OPT 5 100 (ENDOMECHANICALS) ×2 IMPLANT
WATER STERILE IRR 1500ML POUR (IV SOLUTION) ×2 IMPLANT

## 2015-12-01 NOTE — Anesthesia Postprocedure Evaluation (Signed)
Anesthesia Post Note  Patient: Erin Warner  Procedure(s) Performed: Procedure(s) (LRB): XI ROBOTIC ASSISTED TOTAL HYSTERECTOMY, BILATERAL SALPINGECTOMY OOPHORECTOMY WITH SENTINAL LYMPH NODE BIOPSY (N/A)  Patient location during evaluation: PACU Anesthesia Type: General Level of consciousness: awake Pain management: pain level controlled Vital Signs Assessment: post-procedure vital signs reviewed and stable Respiratory status: spontaneous breathing Cardiovascular status: stable Anesthetic complications: no    Last Vitals:  Filed Vitals:   12/01/15 1038 12/01/15 1415  BP: 115/70 118/78  Pulse: 65 82  Temp: 36.7 C 36.3 C  Resp: 16 15    Last Pain:  Filed Vitals:   12/01/15 1433  PainSc: 5                  EDWARDS,Dinora Hemm

## 2015-12-01 NOTE — H&P (View-Only) (Signed)
Consult Note: Gyn-Onc   Erin Warner 60 y.o. female  Chief Complaint  Patient presents with  . 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  Plan:s the primary surgeon.I recommend that the patient undergo robotically assisted hysterectomy bilateral salpingo-oophorectomy with planned intraoperative frozen section to assess tumor size, depth of invasion and grade. Pelvic and aortic lymphadenectomy may be performed should high-risk features be identified.. V Surgical procedure, risks and benefits were reviewed with the patient and her daughter. Potential complications including hemorrhage infection, injury to adjacent viscera, anesthetic risks, and thromboembolic comp locations were reviewed. All questions are answered. Surgery will be scheduled at [facility] with Dr. [name] as the primary surgeon. 12/01/2015.     HPI:60 year old white female seen in consultation request of Dr. Hulan Fray regarding management of a newly diagnosed endometrial carcinoma. The patient presented with several months of abnormal postmenopausal bleeding. Ultrasound showed a thickened endometrium the patient subsequent underwent an endometrial biopsy that showed a grade 1 adenocarcinoma with squamous differentiation. CT scan of the abdomen and pelvis shows liver cysts and no other adenopathy or any evidence of advanced disease. She denies any other pelvic pain pressure or any GI or GU symptoms.  She denies any family history of gynecologic malignancies. She is up-to-date with mammograms.  Review of Systems:10 point review of systems is negative except as noted in interval history.   Vitals: Blood pressure 119/71, pulse 73, temperature 98 F (36.7 C), temperature source Oral, resp. rate 18, height 5\' 4"  (1.626 m), weight 127 lb 3.2 oz (57.698 kg), SpO2 97 %.  Physical Exam: General : The patient is a healthy woman in no acute distress.  HEENT:  normocephalic, extraoccular movements normal; neck is supple without thyromegally  Lynphnodes: Supraclavicular and inguinal nodes not enlarged  Abdomen: Soft, non-tender, no ascites, no organomegally, no masses, no hernias  Pelvic:  EGBUS: Normal female  Vagina: Normal, no lesions  Urethra and Bladder: Normal, non-tender  Cervix: Normal although she does have some prolapse.  Uterus: Normal shape size and consistency. Bi-manual examination: Non-tender; no adenxal masses or nodularity  Rectal: normal sphincter tone, no masses, no blood  Lower extremities: No edema or varicosities. Normal range of motion      No Known Allergies  History reviewed. No pertinent past medical history.  Past Surgical History  Procedure Laterality Date  . Bunionectomy  2009  . Basal cell removal  2016    Current Outpatient Prescriptions  Medication Sig Dispense Refill  . Multiple Vitamin (MULTIVITAMIN) tablet Take 1 tablet by mouth daily.    . Vitamin D, Ergocalciferol, (DRISDOL) 50000 units CAPS capsule Take 1 capsule (50,000 Units total) by mouth every 7 (seven) days. For 6 weeks. 6 capsule 0  . acetaminophen (TYLENOL) 500 MG tablet Take 500 mg by mouth every 6 (six) hours as needed. Reported on 11/18/2015     No current facility-administered medications for this visit.    Social History   Social History  . Marital Status: Widowed    Spouse Name: N/A  . Number of Children: N/A  . Years of Education: N/A   Occupational History  . Not on file.   Social History Main Topics  . Smoking status: Never Smoker   . Smokeless tobacco: Not on file  . Alcohol Use: No  . Drug Use: No  . Sexual Activity: Not Currently    Birth Control/ Protection: Post-menopausal   Other Topics Concern  .  Not on file   Social History Narrative    Family History  Problem Relation Age of Onset  . Cancer Mother 36    Lung  . Heart disease Father       Alvino Chapel, MD 11/18/2015, 2:13  PM

## 2015-12-01 NOTE — Op Note (Signed)
OPERATIVE NOTE 12/01/15  Surgeon: Donaciano Eva   Assistants: Dr Lahoma Crocker (an MD assistant was necessary for tissue manipulation, management of robotic instrumentation, retraction and positioning due to the complexity of the case and hospital policies).   Anesthesia: General endotracheal anesthesia  ASA Class: 3   Pre-operative Diagnosis: grade 1 endometrial cancer  Post-operative Diagnosis: same  Operation: Robotic-assisted laparoscopic total hysterectomy with bilateral salpingoophorectomy, sentinel lymph node biopsy  Surgeon: Donaciano Eva  Assistant Surgeon: Lahoma Crocker MD  Anesthesia: GET  Urine Output: 200  Operative Findings:  : 6cm uterus, normal in appearance, normal appearing ovaries. Bilateral mapping. Omental adhesions to anterior abdominal wall.  Estimated Blood Loss:  less than 50 mL      Total IV Fluids: 800 ml         Specimens: uterus, cervix, bilateral tubes and ovaries, right common iliac SLN, left obturator SLN.         Complications:  None; patient tolerated the procedure well.         Disposition: PACU - hemodynamically stable.  Procedure Details  The patient was seen in the Holding Room. The risks, benefits, complications, treatment options, and expected outcomes were discussed with the patient.  The patient concurred with the proposed plan, giving informed consent.  The site of surgery properly noted/marked. The patient was identified as Erin Warner and the procedure verified as a Robotic-assisted hysterectomy with bilateral salpingo oophorectomy and SLN biopsy. A Time Out was held and the above information confirmed.  After induction of anesthesia, the patient was draped and prepped in the usual sterile manner. Pt was placed in supine position after anesthesia and draped and prepped in the usual sterile manner. The abdominal drape was placed after the CholoraPrep had been allowed to dry for 3 minutes.  Her arms were  tucked to her side with all appropriate precautions.  The shoulders were stabilized with padded shoulder blocks applied to the acromium processes.  The patient was placed in the semi-lithotomy position in [place].  The perineum was prepped with Betadine. The patient was then prepped. Foley catheter was placed.  A sterile speculum was placed in the vagina.  The cervix was grasped with a single-tooth tenaculum and dilated with Kennon Rounds dilators.  1mg  total of ICG was injected into the cervical stroma at 2 and 9 o'clock at a 8mm depth (concentration 0..5mg /ml). The ZUMI uterine manipulator with a medium colpotomizer ring was placed without difficulty.  A pneum occluder balloon was placed over the manipulator.  OG tube placement was confirmed and to suction.   Next, a 5 mm skin incision was made 1 cm below the subcostal margin in the midclavicular line.  The 5 mm Optiview port and scope was used for direct entry.  Opening pressure was under 10 mm CO2.  The abdomen was insufflated and the findings were noted as above.   At this point and all points during the procedure, the patient's intra-abdominal pressure did not exceed 15 mmHg. Next, a 10 mm skin incision was made in the umbilicus and a right and left port was placed about 10 cm lateral to the robot port on the right and left side.  A fourth arm was placed in the left lower quadrant 2 cm above and superior and medial to the anterior superior iliac spine.  All ports were placed under direct visualization.  The patient was placed in steep Trendelenburg. The omentum was taken down from the anterior abdominal wall using sharp dissection.  Some bleeding was encountered on the omentum which was later made hemostatic using the robotic bipolar instrument with care to dissect the omentum that was bleeding off of the transverse colon. Bowel was folded away into the upper abdomen.  The robot was docked in the normal manner.  The right and left peritoneum were opened  parallel to the IP ligament to open the retroperitoneal spaces bilaterally. The SLN mapping was performed in bilateral pelvic basins. The para rectal and paravesical spaces were opened up. Lymphatic channels were identified travelling to the following visualized sentinel lymph node's: right common iliac (medial to the common iliac artery) and left obturator SLN. These SLN's were separated from their surrounding lymphatic tissue, removed and sent for permanent pathology.  The hysterectomy was started after the round ligament on the right side was incised and the retroperitoneum was entered and the pararectal space was developed.  The ureter was noted to be on the medial leaf of the broad ligament.  The peritoneum above the ureter was incised and stretched and the infundibulopelvic ligament was skeletonized, cauterized and cut.  The posterior peritoneum was taken down to the level of the KOH ring.  The anterior peritoneum was also taken down.  The bladder flap was created to the level of the KOH ring.  The uterine artery on the right side was skeletonized, cauterized and cut in the normal manner.  A similar procedure was performed on the left.  The colpotomy was made and the uterus, cervix, bilateral ovaries and tubes were amputated and delivered through the vagina.  Pedicles were inspected and excellent hemostasis was achieved.    The colpotomy at the vaginal cuff was closed with Vicryl on a CT1 needle in a running manner.  Irrigation was used and excellent hemostasis was achieved.  At this point in the procedure was completed.  Robotic instruments were removed under direct visulaization.  The robot was undocked. The 10 mm ports were closed with Vicryl on a UR-5 needle and the fascia was closed with 0 Vicryl on a UR-5 needle.  The skin was closed with 4-0 Vicryl in a subcuticular manner.  Dermabond was applied.  Sponge, lap and needle counts correct x 2.  The patient was taken to the recovery room in stable  condition.  The vagina was swabbed with  minimal bleeding noted.   All instrument and needle counts were correct x  3.   The patient was transferred to the recovery room in a stable condition.  Donaciano Eva, MD

## 2015-12-01 NOTE — Anesthesia Procedure Notes (Signed)
Procedure Name: Intubation Date/Time: 12/01/2015 12:13 PM Performed by: Freddie Breech Pre-anesthesia Checklist: Patient identified, Emergency Drugs available, Suction available, Patient being monitored and Timeout performed Patient Re-evaluated:Patient Re-evaluated prior to inductionOxygen Delivery Method: Circle system utilized Preoxygenation: Pre-oxygenation with 100% oxygen Intubation Type: IV induction Ventilation: Mask ventilation without difficulty Laryngoscope Size: Mac and 3 Grade View: Grade II Tube size: 7.0 mm Number of attempts: 1 Airway Equipment and Method: Patient positioned with wedge pillow and Stylet Placement Confirmation: ETT inserted through vocal cords under direct vision,  positive ETCO2,  CO2 detector and breath sounds checked- equal and bilateral Secured at: 21 cm Tube secured with: Tape Dental Injury: Teeth and Oropharynx as per pre-operative assessment

## 2015-12-01 NOTE — Anesthesia Preprocedure Evaluation (Addendum)
Anesthesia Evaluation  Patient identified by MRN, date of birth, ID band Patient awake  General Assessment Comment:History noted. CE  History of Anesthesia Complications (+) PONV  Airway Mallampati: II  TM Distance: >3 FB Neck ROM: Full    Dental   Pulmonary neg pulmonary ROS,    breath sounds clear to auscultation       Cardiovascular negative cardio ROS   Rhythm:Regular Rate:Normal     Neuro/Psych    GI/Hepatic negative GI ROS, Neg liver ROS,   Endo/Other  negative endocrine ROS  Renal/GU negative Renal ROS     Musculoskeletal   Abdominal   Peds  Hematology   Anesthesia Other Findings   Reproductive/Obstetrics                             Anesthesia Physical Anesthesia Plan  ASA: II  Anesthesia Plan: General   Post-op Pain Management:    Induction: Intravenous  Airway Management Planned: Oral ETT  Additional Equipment:   Intra-op Plan:   Post-operative Plan: Extubation in OR  Informed Consent: I have reviewed the patients History and Physical, chart, labs and discussed the procedure including the risks, benefits and alternatives for the proposed anesthesia with the patient or authorized representative who has indicated his/her understanding and acceptance.   Dental advisory given  Plan Discussed with: CRNA, Anesthesiologist and Surgeon  Anesthesia Plan Comments:        Anesthesia Quick Evaluation

## 2015-12-01 NOTE — Transfer of Care (Signed)
Immediate Anesthesia Transfer of Care Note  Patient: Erin Warner  Procedure(s) Performed: Procedure(s): XI ROBOTIC ASSISTED TOTAL HYSTERECTOMY, BILATERAL SALPINGECTOMY OOPHORECTOMY WITH SENTINAL LYMPH NODE BIOPSY (N/A)  Patient Location: PACU  Anesthesia Type:General  Level of Consciousness:  sedated, patient cooperative and responds to stimulation  Airway & Oxygen Therapy:Patient Spontanous Breathing and Patient connected to face mask oxgen  Post-op Assessment:  Report given to PACU RN and Post -op Vital signs reviewed and stable  Post vital signs:  Reviewed and stable  Last Vitals:  Filed Vitals:   12/01/15 1038  BP: 115/70  Pulse: 65  Temp: 36.7 C  Resp: 16    Complications: No apparent anesthesia complications

## 2015-12-01 NOTE — Interval H&P Note (Signed)
History and Physical Interval Note:  12/01/2015 11:35 AM  Erin Warner  has presented today for surgery, with the diagnosis of endometrial cancer  The various methods of treatment have been discussed with the patient and family. After consideration of risks, benefits and other options for treatment, the patient has consented to  Procedure(s): XI ROBOTIC ASSISTED TOTAL HYSTERECTOMY WITH SENTINAL LYMPH NODE BIOPSY (N/A) as a surgical intervention .  The patient's history has been reviewed, patient examined, no change in status, stable for surgery.  I have reviewed the patient's chart and labs.  Questions were answered to the patient's satisfaction.     Donaciano Eva

## 2015-12-02 DIAGNOSIS — C541 Malignant neoplasm of endometrium: Secondary | ICD-10-CM | POA: Diagnosis not present

## 2015-12-02 LAB — BASIC METABOLIC PANEL
Anion gap: 6 (ref 5–15)
BUN: 13 mg/dL (ref 6–20)
CALCIUM: 8.2 mg/dL — AB (ref 8.9–10.3)
CO2: 25 mmol/L (ref 22–32)
CREATININE: 0.73 mg/dL (ref 0.44–1.00)
Chloride: 105 mmol/L (ref 101–111)
GFR calc Af Amer: >60 mL/min (ref >60)
GLUCOSE: 118 mg/dL — AB (ref 65–99)
Potassium: 4.2 mmol/L (ref 3.5–5.1)
SODIUM: 136 mmol/L (ref 135–145)

## 2015-12-02 LAB — CBC
HCT: 36.2 % (ref 36.0–46.0)
Hemoglobin: 12.5 g/dL (ref 12.0–15.0)
MCH: 28.5 pg (ref 26.0–34.0)
MCHC: 34.5 g/dL (ref 30.0–36.0)
MCV: 82.5 fL (ref 78.0–100.0)
PLATELETS: 241 10*3/uL (ref 150–400)
RBC: 4.39 MIL/uL (ref 3.87–5.11)
RDW: 12.8 % (ref 11.5–15.5)
WBC: 6.7 10*3/uL (ref 4.0–10.5)

## 2015-12-02 MED ORDER — TRAMADOL HCL 50 MG PO TABS: 50.0000 mg | ORAL_TABLET | Freq: Four times a day (QID) | ORAL | Status: DC | PRN

## 2015-12-02 NOTE — Discharge Instructions (Signed)
12/02/2015  Return to work: 4-6 weeks if applicable  Activity: 1. Be up and out of the bed during the day.  Take a nap if needed.  You may walk up steps but be careful and use the hand rail.  Stair climbing will tire you more than you think, you may need to stop part way and rest.   2. No lifting or straining for 6 weeks.  3. No driving for 1 week(s).  Do not drive if you are taking narcotic pain medicine.  4. Shower daily.  Use soap and water on your incision and pat dry; don't rub.  No tub baths until cleared by your surgeon.   5. No sexual activity and nothing in the vagina for 6 weeks.  6. You may experience a small amount of clear drainage from your incisions, which is normal.  If the drainage persists or increases, please call the office.   Diet: 1. Low sodium Heart Healthy Diet is recommended.  2. It is safe to use a laxative, such as Miralax or Colace, if you have difficulty moving your bowels.   Wound Care: 1. Keep clean and dry.  Shower daily.  Reasons to call the Doctor:  Fever - Oral temperature greater than 100.4 degrees Fahrenheit  Foul-smelling vaginal discharge  Difficulty urinating  Nausea and vomiting  Increased pain at the site of the incision that is unrelieved with pain medicine.  Difficulty breathing with or without chest pain  New calf pain especially if only on one side  Sudden, continuing increased vaginal bleeding with or without clots.   Contacts: For questions or concerns you should contact:  Dr. [name] at [phone]  Erin John, NP at [phone]  After Hours: call [phone] and have the GYN Oncologist paged/contacted  Tramadol tablets What is this medicine? TRAMADOL (TRA ma dole) is a pain reliever. It is used to treat moderate to severe pain in adults. This medicine may be used for other purposes; ask your health care provider or pharmacist if you have questions. What should I tell my health care provider before I  take this medicine? They need to know if you have any of these conditions: -brain tumor -depression -drug abuse or addiction -head injury -if you frequently drink alcohol containing drinks -kidney disease or trouble passing urine -liver disease -lung disease, asthma, or breathing problems -seizures or epilepsy -suicidal thoughts, plans, or attempt; a previous suicide attempt by you or a family member -an unusual or allergic reaction to tramadol, codeine, other medicines, foods, dyes, or preservatives -pregnant or trying to get pregnant -breast-feeding How should I use this medicine? Take this medicine by mouth with a full glass of water. Follow the directions on the prescription label. If the medicine upsets your stomach, take it with food or milk. Do not take more medicine than you are told to take. Talk to your pediatrician regarding the use of this medicine in children. Special care may be needed. Overdosage: If you think you have taken too much of this medicine contact a poison control center or emergency room at once. NOTE: This medicine is only for you. Do not share this medicine with others. What if I miss a dose? If you miss a dose, take it as soon as you can. If it is almost time for your next dose, take only that dose. Do not take double or extra doses. What may interact with this medicine? Do not take this medicine with any of the following medications: -MAOIs like  Carbex, Eldepryl, Marplan, Nardil, and Parnate This medicine may also interact with the following medications: -alcohol or medicines that contain alcohol -antihistamines -benzodiazepines -bupropion -carbamazepine or oxcarbazepine -clozapine -cyclobenzaprine -digoxin -furazolidone -linezolid -medicines for depression, anxiety, or psychotic disturbances -medicines for migraine headache like almotriptan, eletriptan, frovatriptan, naratriptan, rizatriptan, sumatriptan, zolmitriptan -medicines for pain like  pentazocine, buprenorphine, butorphanol, meperidine, nalbuphine, and propoxyphene -medicines for sleep -muscle relaxants -naltrexone -phenobarbital -phenothiazines like perphenazine, thioridazine, chlorpromazine, mesoridazine, fluphenazine, prochlorperazine, promazine, and trifluoperazine -procarbazine -warfarin This list may not describe all possible interactions. Give your health care provider a list of all the medicines, herbs, non-prescription drugs, or dietary supplements you use. Also tell them if you smoke, drink alcohol, or use illegal drugs. Some items may interact with your medicine. What should I watch for while using this medicine? Tell your doctor or health care professional if your pain does not go away, if it gets worse, or if you have new or a different type of pain. You may develop tolerance to the medicine. Tolerance means that you will need a higher dose of the medicine for pain relief. Tolerance is normal and is expected if you take this medicine for a long time. Do not suddenly stop taking your medicine because you may develop a severe reaction. Your body becomes used to the medicine. This does NOT mean you are addicted. Addiction is a behavior related to getting and using a drug for a non-medical reason. If you have pain, you have a medical reason to take pain medicine. Your doctor will tell you how much medicine to take. If your doctor wants you to stop the medicine, the dose will be slowly lowered over time to avoid any side effects. You may get drowsy or dizzy. Do not drive, use machinery, or do anything that needs mental alertness until you know how this medicine affects you. Do not stand or sit up quickly, especially if you are an older patient. This reduces the risk of dizzy or fainting spells. Alcohol can increase or decrease the effects of this medicine. Avoid alcoholic drinks. You may have constipation. Try to have a bowel movement at least every 2 to 3 days. If you do not  have a bowel movement for 3 days, call your doctor or health care professional. Your mouth may get dry. Chewing sugarless gum or sucking hard candy, and drinking plenty of water may help. Contact your doctor if the problem does not go away or is severe. What side effects may I notice from receiving this medicine? Side effects that you should report to your doctor or health care professional as soon as possible: -allergic reactions like skin rash, itching or hives, swelling of the face, lips, or tongue -breathing difficulties, wheezing -confusion -itching -light headedness or fainting spells -redness, blistering, peeling or loosening of the skin, including inside the mouth -seizures Side effects that usually do not require medical attention (report to your doctor or health care professional if they continue or are bothersome): -constipation -dizziness -drowsiness -headache -nausea, vomiting This list may not describe all possible side effects. Call your doctor for medical advice about side effects. You may report side effects to FDA at 1-800-FDA-1088. Where should I keep my medicine? Keep out of the reach of children. This medicine may cause accidental overdose and death if it taken by other adults, children, or pets. Mix any unused medicine with a substance like cat litter or coffee grounds. Then throw the medicine away in a sealed container like a sealed bag or  a coffee can with a lid. Do not use the medicine after the expiration date. Store at room temperature between 15 and 30 degrees C (59 and 86 degrees F). NOTE: This sheet is a summary. It may not cover all possible information. If you have questions about this medicine, talk to your doctor, pharmacist, or health care provider.    2016, Elsevier/Gold Standard. (2013-11-06 15:42:09)  Abdominal Hysterectomy, Care After These instructions give you information on caring for yourself after your procedure. Your doctor may also give you  more specific instructions. Call your doctor if you have any problems or questions after your procedure.  HOME CARE It takes 4-6 weeks to recover from this surgery. Follow all of your doctor's instructions.   Only take medicines as told by your doctor.  Change your bandage as told by your doctor.  Return to your doctor to have your stitches taken out.  Take showers for 2-3 weeks. Ask your doctor when it is okay to shower.  Do not douche, use tampons, or have sex (intercourse) for at least 6 weeks or as told.  Follow your doctor's advice about exercise, lifting objects, driving, and general activities.  Get plenty of rest and sleep.  Do not lift anything heavier than a gallon of milk (about 10 pounds [4.5 kilograms]) for the first month after surgery.  Get back to your normal diet as told by your doctor.  Do not drink alcohol until your doctor says it is okay.  Take a medicine to help you poop (laxative) as told by your doctor.  Eating foods high in fiber may help you poop. Eat a lot of raw fruits and vegetables, whole grains, and beans.  Drink enough fluids to keep your pee (urine) clear or pale yellow.  Have someone help you at home for 1-2 weeks after your surgery.  Keep follow-up doctor visits as told. GET HELP IF:  You have chills or fever.  You have puffiness, redness, or pain in area of the cut (incision).  You have yellowish-white fluid (pus) coming from the cut.  You have a bad smell coming from the cut or bandage.  Your cut pulls apart.  You feel dizzy or light-headed.  You have pain or bleeding when you pee.  You keep having watery poop (diarrhea).  You keep feeling sick to your stomach (nauseous) or keep throwing up (vomiting).  You have fluid (discharge) coming from your vagina.  You have a rash.  You have a reaction to your medicine.  You need stronger pain medicine. GET HELP RIGHT AWAY IF:   You have a fever and your symptoms suddenly get  worse.  You have bad belly (abdominal) pain.  You have chest pain.  You are short of breath.  You pass out (faint).  You have pain, puffiness, or redness of your leg.  You bleed a lot from your vagina and notice clumps of tissue (clots). MAKE SURE YOU:   Understand these instructions.  Will watch your condition.  Will get help right away if you are not doing well or get worse.   This information is not intended to replace advice given to you by your health care provider. Make sure you discuss any questions you have with your health care provider.   Document Released: 06/19/2008 Document Revised: 09/15/2013 Document Reviewed: 07/03/2013 Elsevier Interactive Patient Education [employer].

## 2015-12-02 NOTE — Discharge Summary (Signed)
Physician Discharge Summary  Patient ID: Erin Warner MRN: [id number] DOB/AGE: [birth year] 60 y.o.  Admit date: 12/01/2015 Discharge date: 12/02/2015  Admission Diagnoses: Endometrial carcinoma [facility])  Discharge Diagnoses:  Principal Problem:   Endometrial carcinoma ([place]) Active Problems:   Endometrial cancer [facility])   Discharged Condition:  The patient is in good condition and stable for discharge.    Hospital Course: On 12/01/2015, the patient underwent the following: Procedure(s): XI ROBOTIC ASSISTED TOTAL HYSTERECTOMY, BILATERAL SALPINGECTOMY OOPHORECTOMY WITH SENTINAL LYMPH NODE BIOPSY.   The postoperative course was uneventful.  She was discharged to home on postoperative day 1 tolerating a regular diet, voiding, minimal pain, passing flatus.  Consults: None  Significant Diagnostic Studies: None  Treatments: surgery: see above  Discharge Exam: Blood pressure 113/59, pulse 61, temperature 97.9 F (36.6 C), temperature source Oral, resp. rate 16, height 5' 3.5" (1.613 m), weight 129 lb 4 oz (58.627 kg), SpO2 99 %. General appearance: alert, cooperative and no distress Resp: clear to auscultation bilaterally Cardio: regular rate and rhythm, S1, S2 normal, no murmur, click, rub or gallop GI: soft, non-tender; bowel sounds normal; no masses,  no organomegaly Extremities: extremities normal, atraumatic, no cyanosis or edema Incision/Wound: Lap sites to the abdomen with dermabond without erythema or drainage  Disposition: Home  Discharge Instructions    Call MD for:  difficulty breathing, headache or visual disturbances    Complete by:  As directed      Call MD for:  extreme fatigue    Complete by:  As directed      Call MD for:  hives    Complete by:  As directed      Call MD for:  persistant dizziness or light-headedness    Complete by:  As directed      Call MD for:  persistant nausea and vomiting    Complete by:  As directed      Call MD for:  redness, tenderness,  or signs of infection (pain, swelling, redness, odor or green/yellow discharge around incision site)    Complete by:  As directed      Call MD for:  severe uncontrolled pain    Complete by:  As directed      Call MD for:  temperature >100.4    Complete by:  As directed      Diet - low sodium heart healthy    Complete by:  As directed      Driving Restrictions    Complete by:  As directed   No driving for 1 week.  Do not take narcotics and drive.     Increase activity slowly    Complete by:  As directed      Lifting restrictions    Complete by:  As directed   No lifting greater than 10 lbs.     Sexual Activity Restrictions    Complete by:  As directed   No sexual activity, nothing in the vagina, for 6 weeks.            Medication List    TAKE these medications        acetaminophen 500 MG tablet  Commonly known as:  TYLENOL  Take 500 mg by mouth every 6 (six) hours as needed for headache. Reported on 11/18/2015     multivitamin tablet  Take 1 tablet by mouth daily.     traMADol 50 MG tablet  Commonly known as:  ULTRAM  Take 1-2 tablets (50-100 mg total) by mouth every 6 (  six) hours as needed for moderate pain or severe pain.     Vitamin D (Ergocalciferol) 50000 units Caps capsule  Commonly known as:  DRISDOL  Take 1 capsule (50,000 Units total) by mouth every 7 (seven) days. For 6 weeks.           Follow-up Information    Follow up with Donaciano Eva, MD On 12/26/2015.   Specialty:  Obstetrics and Gynecology   Why:  at 3:15pm at the [facility] for post-op check   Contact information:   [address] [place] [address] [phone]       Greater than thirty minutes were spend for face to face discharge instructions and discharge orders/summary in EPIC.   Signed: Shedric Fredericks DEAL 12/02/2015, 8:17 AM

## 2015-12-26 ENCOUNTER — Encounter: Payer: Self-pay | Admitting: Gynecologic Oncology

## 2015-12-26 ENCOUNTER — Ambulatory Visit: Payer: BLUE CROSS/BLUE SHIELD | Attending: Gynecologic Oncology | Admitting: Gynecologic Oncology

## 2015-12-26 VITALS — BP 107/60 | HR 66 | Temp 98.1°F | Resp 18 | Ht 63.5 in | Wt 127.3 lb

## 2015-12-26 DIAGNOSIS — Z9079 Acquired absence of other genital organ(s): Secondary | ICD-10-CM | POA: Insufficient documentation

## 2015-12-26 DIAGNOSIS — C541 Malignant neoplasm of endometrium: Secondary | ICD-10-CM | POA: Diagnosis present

## 2015-12-26 DIAGNOSIS — Z8541 Personal history of malignant neoplasm of cervix uteri: Secondary | ICD-10-CM | POA: Insufficient documentation

## 2015-12-26 DIAGNOSIS — Z90722 Acquired absence of ovaries, bilateral: Secondary | ICD-10-CM | POA: Insufficient documentation

## 2015-12-26 DIAGNOSIS — Z90710 Acquired absence of both cervix and uterus: Secondary | ICD-10-CM | POA: Insufficient documentation

## 2015-12-26 NOTE — Progress Notes (Signed)
ENDOMETRIAL CANCER FOLLOW-UP  Assessment:    60 y.o. 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   S/p robotic assisted total hysterectomy, BSO, and sentinel lymph node biopsy on 12/01/15. No LVSI, 33% myometrial invasion, negative pelvic washings and negative lymph nodes.   Plan: 1) Pathology reports reviewed today 2) Treatment counseling - Very low risk (<5%) for recurrence given age, grade, depth of myometrial invasion and LVSI status. Multidisciplinary tumor board recommendation is for routine surveillance with frequent pelvic exams and visits with annual pap smear.  We will start with visits every 6 months x 5 years, at which time she can return to annual visits.  Discussed signs and symptoms of recurrence including vaginal bleeding or discharge, leg pain or swelling and changes in bowel or bladder habits. She was given the opportunity to ask questions, which were answered to her satisfaction, and she is agreement with the above mentioned plan of care.  3)  Return to clinic in 6 months.   HPI:  Erin Warner is a 60 y.o. year old [id number] initially seen in consultation on 11/18/15 referred by Dr Hulan Fray for grade 2 endometrial cancer.  She then underwent a robotic assisted total hysterectomy, BSO, SLN biopsy on 0000000 without complications.  Her postoperative course was uncomplicated.  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  She is seen today for a postoperative check and to discuss her pathology results and treatment plan.  Since discharge from the hospital, she is feeling well.  She has improving appetite, normal bowel and bladder function, and pain controlled with minimal PO medication. She has no other complaints today.  Current Outpatient Prescriptions on File Prior to Visit  Medication Sig Dispense Refill  . acetaminophen  (TYLENOL) 500 MG tablet Take 500 mg by mouth every 6 (six) hours as needed for headache. Reported on 11/18/2015    . Multiple Vitamin (MULTIVITAMIN) tablet Take 1 tablet by mouth daily.     No current facility-administered medications on file prior to visit.   No Known Allergies Past Medical History  Diagnosis Date  . Cancer ([place])     basal cell, dx.2'17  cervical cancer- surgery planned.   Past Surgical History  Procedure Laterality Date  . Bunionectomy  2009  . Basal cell removal  2016    back  . Robotic assisted total hysterectomy N/A 12/01/2015    Procedure: XI ROBOTIC ASSISTED TOTAL HYSTERECTOMY, BILATERAL SALPINGECTOMY OOPHORECTOMY WITH SENTINAL LYMPH NODE BIOPSY;  Surgeon: [name], MD;  Location: [facility] ORS;  Service: Gynecology;  Laterality: N/A;   Family History  Problem Relation Age of Onset  . Cancer Mother 18    Lung  . Heart disease Father    Social History   Social History  . Marital Status: Widowed    Spouse Name: N/A  . Number of Children: N/A  . Years of Education: N/A   Occupational History  . Not on file.   Social History Main Topics  . Smoking status: Never Smoker   . Smokeless tobacco: Not on file  . Alcohol Use: No  . Drug Use: No  . Sexual Activity: Not Currently    Birth Control/ Protection: Post-menopausal   Other Topics Concern  . Not on file   Social History Narrative    Review of systems: Constitutional:  She has no weight gain or weight loss. She has  no fever or chills. Eyes: No blurred vision Ears, Nose, Mouth, Throat: No dizziness, headaches or changes in hearing. No mouth sores. Cardiovascular: No chest pain, palpitations or edema. Respiratory:  No shortness of breath, wheezing or cough Gastrointestinal: She has normal bowel movements without diarrhea or constipation. She denies any nausea or vomiting. She denies blood in her stool or heart burn. Genitourinary:  She denies pelvic pain, pelvic pressure or changes in her urinary  function. She has no hematuria, dysuria, or incontinence. She has no irregular vaginal bleeding or vaginal discharge Musculoskeletal: Denies muscle weakness or joint pains.  Skin:  She has no skin changes, rashes or itching Neurological:  Denies dizziness or headaches. No neuropathy, no numbness or tingling. Psychiatric:  She denies depression or anxiety. Hematologic/Lymphatic:   No easy bruising or bleeding   Physical Exam: Blood pressure 107/60, pulse 66, temperature 98.1 F (36.7 C), temperature source Oral, resp. rate 18, height 5' 3.5" (1.613 m), weight 127 lb 4.8 oz (57.743 kg), SpO2 99 %. General: Well dressed, well nourished in no apparent distress.   HEENT:  Normocephalic and atraumatic, no lesions.  Extraocular muscles intact. Sclerae anicteric. Pupils equal, round, reactive. No mouth sores or ulcers. Thyroid is normal size, not nodular, midline. Lungs:  Clear to auscultation bilaterally.  No wheezes. Cardiovascular:  Regular rate and rhythm.  No murmurs or rubs. Abdomen:  Soft, nontender, nondistended.  No palpable masses.  No hepatosplenomegaly.  No ascites. Normal bowel sounds.  No hernias.  Incisions are well healed. Genitourinary: Normal EGBUS  Vaginal cuff intact.  No bleeding or discharge.  No cul de sac fullness. Extremities: No cyanosis, clubbing or edema.  No calf tenderness or erythema. No palpable cords. Psychiatric: Mood and affect are appropriate. Neurological: Awake, alert and oriented x 3. Sensation is intact, no neuropathy.  Musculoskeletal: No pain, normal strength and range of motion.   20 minutes of direct face to face counseling time was spent with the patient. This included discussion about prognosis, therapy recommendations and postoperative side effects and are beyond the scope of routine postoperative care.  Donaciano Eva, MD

## 2015-12-26 NOTE — Patient Instructions (Signed)
Plan to follow up with Dr [name] in September , call in late July to schedule your follow up appointment. Call with any changes , questions or concerns.  Thank you!

## 2016-01-10 ENCOUNTER — Telehealth: Payer: Self-pay | Admitting: Gynecologic Oncology

## 2016-01-10 NOTE — Telephone Encounter (Signed)
Patient called reporting light pink spotting after having several strong sneezes the day before.  Advised to continue to monitor and call for any changes.  Advised the sutures at the top of the vagina start to dissolve at this time which is normal and you can see light pink spotting.  Denies fever, chills.  Advised to call for any needs.

## 2016-09-28 ENCOUNTER — Ambulatory Visit (INDEPENDENT_AMBULATORY_CARE_PROVIDER_SITE_OTHER): Payer: BLUE CROSS/BLUE SHIELD | Admitting: Obstetrics & Gynecology

## 2016-09-28 ENCOUNTER — Encounter: Payer: Self-pay | Admitting: Obstetrics & Gynecology

## 2016-09-28 DIAGNOSIS — C541 Malignant neoplasm of endometrium: Secondary | ICD-10-CM

## 2016-09-28 DIAGNOSIS — N898 Other specified noninflammatory disorders of vagina: Secondary | ICD-10-CM

## 2016-09-28 DIAGNOSIS — Z0000 Encounter for general adult medical examination without abnormal findings: Secondary | ICD-10-CM

## 2016-09-28 NOTE — Addendum Note (Signed)
Addended by: Emily Filbert on: 09/28/2016 10:37 AM   Modules accepted: Orders

## 2016-09-28 NOTE — Progress Notes (Addendum)
   Subjective:    Patient ID: Erin Warner, female    DOB: 21-Dec-1955, 60 y.o.   MRN: [id number]  HPI 61 yo [place] lady with STage 1A Grade 2 endometroid endometrial cancer. She is here for a 6 month follow up.  She is not sexually active. She denies any problems.  Review of Systems     Objective:   Physical Exam [id number] Breathing, conversing, and ambulating normally Abd- benign Bimanual exam negative 2 vaginal cuff lesions: at the right edge of the cuff there is a reddened area (resembles granulation tissue) In the middle of the vaginal cuff there is a 5-7 mm white raised area. I removed this area entirely with a Kevorkian biopsy forcep and biopsied the edge of the reddened lesion. Silver nitrate yielded hemostasis. She tolerated the procedure well.       Assessment & Plan:  H/o endometrial cancer- await pathology results  [name] came back about an hour after her biopsies with her legs shaking and some rectal pain and pressure, denies any heavy bleeding.  On exam her legs are shaking some, her biopsy sites are hemostatic. I cannot find an etiology of her legs shaking.  I have advised her of my findings and rec'd urgent care if the shaking doesn't resolve in the near future.   I will order a GI referral for a colon cancer screening.

## 2016-10-05 ENCOUNTER — Telehealth: Payer: Self-pay | Admitting: Gynecologic Oncology

## 2016-10-05 ENCOUNTER — Encounter: Payer: Self-pay | Admitting: Gastroenterology

## 2016-10-05 DIAGNOSIS — C541 Malignant neoplasm of endometrium: Secondary | ICD-10-CM

## 2016-10-05 NOTE — Telephone Encounter (Signed)
Patient called with concerns about her recent biopsy.  She states Dr. Hulan Fray performed a vaginal biopsy and she spoke with her shortly about the results but still has questions.  Answered all questions and advised her that I would touch base with Dr. Denman George to see what imaging she would like to evaluate for metastatic disease.  Follow up appt with Dr. Denman George made for Jan 19.  Advised patient that we would contact her with Dr. Serita Grit recommendations.

## 2016-10-05 NOTE — Telephone Encounter (Signed)
Returned call to patient and discussed upcoming appts.  All instructions discussed.  Advised to call for any needs or concerns.

## 2016-10-08 ENCOUNTER — Other Ambulatory Visit (HOSPITAL_BASED_OUTPATIENT_CLINIC_OR_DEPARTMENT_OTHER): Payer: BLUE CROSS/BLUE SHIELD

## 2016-10-08 DIAGNOSIS — C541 Malignant neoplasm of endometrium: Secondary | ICD-10-CM

## 2016-10-08 LAB — BASIC METABOLIC PANEL
ANION GAP: 9 meq/L (ref 3–11)
BUN: 13.7 mg/dL (ref 7.0–26.0)
CALCIUM: 9.7 mg/dL (ref 8.4–10.4)
CO2: 27 mEq/L (ref 22–29)
Chloride: 108 mEq/L (ref 98–109)
Creatinine: 0.8 mg/dL (ref 0.6–1.1)
EGFR: 83 mL/min/{1.73_m2} — AB (ref >90)
Glucose: 95 mg/dl (ref 70–140)
POTASSIUM: 4.6 meq/L (ref 3.5–5.1)
SODIUM: 144 meq/L (ref 136–145)

## 2016-10-09 ENCOUNTER — Ambulatory Visit (HOSPITAL_COMMUNITY)
Admission: RE | Admit: 2016-10-09 | Discharge: 2016-10-09 | Disposition: A | Discharge status: Home / Self Care | Payer: BLUE CROSS/BLUE SHIELD | Source: Ambulatory Visit | Attending: Gynecologic Oncology | Admitting: Gynecologic Oncology

## 2016-10-09 DIAGNOSIS — R911 Solitary pulmonary nodule: Secondary | ICD-10-CM | POA: Insufficient documentation

## 2016-10-09 DIAGNOSIS — C541 Malignant neoplasm of endometrium: Secondary | ICD-10-CM | POA: Diagnosis present

## 2016-10-09 MED ORDER — IOPAMIDOL (ISOVUE-300) INJECTION 61%
INTRAVENOUS | Status: AC
  Administered 2016-10-09: 100 mL
  Filled 2016-10-09: qty 100

## 2016-10-10 ENCOUNTER — Telehealth: Payer: Self-pay | Admitting: Gynecologic Oncology

## 2016-10-10 NOTE — Telephone Encounter (Signed)
Patient informed of CT scan results.  No concerns voiced.  Advised to call for any needs or concerns before her appt on Friday

## 2016-10-10 NOTE — Telephone Encounter (Signed)
See telephone note.

## 2016-10-11 NOTE — Progress Notes (Signed)
ENDOMETRIAL CANCER FOLLOW-UP  Assessment:    61 y.o. 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   Recurrence within a year of surgery. Very low volume disease. No evidence of distant mets on CT scan (indeterminate left upper lobe nodule on CT).  Plan: 1/ referral to radiation oncology for external beam radiation and vaginal brachytherapy as seen fit by the radiation oncologist. I discussed the 70% chance of complete response to this salvage therapy. 2/ Follow-up imaging (including chest) after completing therapy.   HPI:  Erin Warner is a 61 y.o. year old [id number] initially seen in consultation on 11/18/15 referred by Dr Hulan Fray for grade 2 endometrial cancer.  She then underwent a robotic assisted total hysterectomy, BSO, SLN biopsy on 0000000 without complications.  Her postoperative course was uncomplicated.  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 Given the low risk features on pathology, no adjuvant therapy was recommended in accordance with NCCN guidelines.  Interval Hx:  She is seen today for scheduled discussion regarding results of biopsy at Dr Alease Medina  She was seen by Dr Hulan Fray on 09/28/16 for routine follow-up of her low risk endometrial cancer. 2 5-62mm nodules were seen at the right aspect of the vaginal cuff. The areas were entirely resected (grossly) with the biopsy forcep. Final pathology confirme recurrence of endometrioid adenocarcinoma, consistent with recurrent endometrial cancer.  On 116/18 she had restaging CT of chest, abdomen, pelvis. This showed no evidence of metastatic disease in the abdomen or pelvis and no adenopathy. No discrete mass was seen in the vagina. A solitary 39mm LUL pulmonary nodule was identified in the chest, favored benign.  She has no symptoms including no  bleeding or pain.  Current Outpatient Prescriptions on File Prior to Visit  Medication Sig Dispense Refill  . acetaminophen (TYLENOL) 500 MG tablet Take 500 mg by mouth every 6 (six) hours as needed for headache. Reported on 11/18/2015    . Multiple Vitamin (MULTIVITAMIN) tablet Take 1 tablet by mouth daily.     No current facility-administered medications on file prior to visit.    No Known Allergies Past Medical History:  Diagnosis Date  . Cancer ([place])    basal cell, dx.2'17  cervical cancer- surgery planned.   Past Surgical History:  Procedure Laterality Date  . basal cell removal  2016   back  . BUNIONECTOMY  2009  . ROBOTIC ASSISTED TOTAL HYSTERECTOMY N/A 12/01/2015   Procedure: XI ROBOTIC ASSISTED TOTAL HYSTERECTOMY, BILATERAL SALPINGECTOMY OOPHORECTOMY WITH SENTINAL LYMPH NODE BIOPSY;  Surgeon: [name], MD;  Location: [facility];  Service: Gynecology;  Laterality: N/A;   Family History  Problem Relation Age of Onset  . Cancer Mother 89    Lung  . Heart disease Father    Social History   Social History  . Marital status: Widowed    Spouse name: N/A  . Number of children: N/A  . Years of education: N/A   Occupational History  . Not on file.   Social History Main Topics  . Smoking status: Never Smoker  . Smokeless tobacco: Never Used  . Alcohol use No  . Drug use: No  . Sexual activity: Not Currently    Birth control/ protection: Post-menopausal   Other Topics Concern  . Not on file   Social History Narrative  . No narrative on file  Review of systems: Constitutional:  She has no weight gain or weight loss. She has no fever or chills. Eyes: No blurred vision Ears, Nose, Mouth, Throat: No dizziness, headaches or changes in hearing. No mouth sores. Cardiovascular: No chest pain, palpitations or edema. Respiratory:  No shortness of breath, wheezing or cough Gastrointestinal: She has normal bowel movements without diarrhea or constipation. She denies any  nausea or vomiting. She denies blood in her stool or heart burn. Genitourinary:  She denies pelvic pain, pelvic pressure or changes in her urinary function. She has no hematuria, dysuria, or incontinence. She has no irregular vaginal bleeding or vaginal discharge Musculoskeletal: Denies muscle weakness or joint pains.  Skin:  She has no skin changes, rashes or itching Neurological:  Denies dizziness or headaches. No neuropathy, no numbness or tingling. Psychiatric:  She denies depression or anxiety. Hematologic/Lymphatic:   No easy bruising or bleeding   Physical Exam: There were no vitals taken for this visit. General: Well dressed, well nourished in no apparent distress.   HEENT:  Normocephalic and atraumatic, no lesions.  Extraocular muscles intact. Sclerae anicteric. Pupils equal, round, reactive. No mouth sores or ulcers. Thyroid is normal size, not nodular, midline. Lungs:  Clear to auscultation bilaterally.  No wheezes. Cardiovascular:  Regular rate and rhythm.  No murmurs or rubs. Abdomen:  Soft, nontender, nondistended.  No palpable masses.  No hepatosplenomegaly.  No ascites. Normal bowel sounds.  No hernias.  Incisions are well healed. Genitourinary: Normal EGBUS  Vaginal cuff intact.  Biopsy site in mid cuff identified but with no visible gross lesions visible or palpable including on rectovaginal exam.  Extremities: No cyanosis, clubbing or edema.  No calf tenderness or erythema. No palpable cords. Psychiatric: Mood and affect are appropriate. Neurological: Awake, alert and oriented x 3. Sensation is intact, no neuropathy.  Musculoskeletal: No pain, normal strength and range of motion.   Donaciano Eva, MD

## 2016-10-12 ENCOUNTER — Ambulatory Visit: Payer: BLUE CROSS/BLUE SHIELD | Attending: Gynecologic Oncology | Admitting: Gynecologic Oncology

## 2016-10-12 ENCOUNTER — Encounter: Payer: Self-pay | Admitting: Gynecologic Oncology

## 2016-10-12 VITALS — BP 122/68 | HR 68 | Temp 97.8°F | Resp 19 | Ht 64.0 in | Wt 129.9 lb

## 2016-10-12 DIAGNOSIS — C7982 Secondary malignant neoplasm of genital organs: Secondary | ICD-10-CM | POA: Diagnosis not present

## 2016-10-12 DIAGNOSIS — C541 Malignant neoplasm of endometrium: Secondary | ICD-10-CM | POA: Diagnosis not present

## 2016-10-12 DIAGNOSIS — Z780 Asymptomatic menopausal state: Secondary | ICD-10-CM | POA: Insufficient documentation

## 2016-10-12 DIAGNOSIS — Z79899 Other long term (current) drug therapy: Secondary | ICD-10-CM | POA: Insufficient documentation

## 2016-10-12 NOTE — Patient Instructions (Signed)
Plan to meet with Dr. Gery Pray, Radiation Oncologist, to discuss and arrange for radiation treatments.  You will follow up with Dr. Denman George after you have completed radiation.

## 2016-10-15 ENCOUNTER — Encounter: Payer: Self-pay | Admitting: Gynecologic Oncology

## 2016-10-15 NOTE — Progress Notes (Signed)
Gynecologic Oncology Multi-Disciplinary Disposition Conference Note  Date of the Conference: October 15, 2016  Patient Name: Erin Warner  Referring Provider: Dr. Hulan Fray Primary GYN Oncologist: Dr. [name]  Stage/Disposition:  Recurrent Stage IA Grade 2 endometrioid endometrial cancer.  Disposition is to external beam radiation therapy with vaginal brachytherapy.   This Multidisciplinary conference took place involving physicians from [place], [place], Radiation Oncology, Pathology, Radiology along with the Gynecologic Oncology Nurse Practitioner and RN.  Comprehensive assessment of the patient's malignancy, staging, need for surgery, chemotherapy, radiation therapy, and need for further testing were reviewed. Supportive measures, both inpatient and following discharge were also discussed. The recommended plan of care is documented. Greater than 35 minutes were spent correlating and coordinating this patient's care.

## 2016-10-15 NOTE — Progress Notes (Signed)
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 She was seen by Dr Hulan Fray on 09/28/16 for routine follow-up of her low risk endometrial cancer. 2 5-65mm nodules were seen at the right aspect of the vaginal cuff. The areas were entirely resected (grossly) with the biopsy forcep.   Biopsies revealed:   09/28/16 Diagnosis Vagina, biopsy, right edge of cuff, 9:00 o'clock - ENDOMETRIOID ADENOCARCINOMA.  Diagnosis Vagina, biopsy, middle of cuff, 12:00 o'clock - SLIGHT SQUAMOUS HYPERPLASIA ASSOCIATED WITH FIBROSIS AND MILD INFLAMMATION. - SEE MICROSCOPIC DESCRIPTION. - NO DYSPLASIA OR MALIGNANCY.  12/01/15 Diagnosis 1. Lymph node, sentinel, biopsy, right common iliac THREE BENIGN LYMPH NODES (0/3) 2. Lymph node, sentinel, biopsy, left obturator TWO BENIGN LYMPH NODES (0/2) 3. Uterus +/- tubes/ovaries, neoplastic ENDOMETRIAL ADENOCARCINOMA, FIGO GRADE 2 THE TUMOR INVADES LESS THAN ONE HALF OF THE MYOMETRIUM NO DEFINITIVE LYMPHOVASCULAR INVASION IDENTIFIED MARGINS OF RESECTION ARE NEGATIVE FOR TUMOR LEFT OVARY: MUCINOUS CYSTADENOMA RIGHT OVARY AND BILATERAL FALLOPIAN TUBES: HISTOLOGICAL UNREMARKABLE  Past/Anticipated interventions by Gyn/Onc surgery, if any: 12/01/15 - Procedure: XI ROBOTIC ASSISTED TOTAL HYSTERECTOMY, BILATERAL SALPINGECTOMY OOPHORECTOMY WITH SENTINAL LYMPH NODE BIOPSY;  Surgeon: [name], MD  Past/Anticipated interventions by medical oncology, if any: no   Weight changes, if any: no  Bowel/Bladder complaints, if any: no  Nausea/Vomiting, if any: no  Pain issues, if any:  no  SAFETY ISSUES:  Prior radiation? no  Pacemaker/ICD? no  Possible current pregnancy? no  Is the patient on methotrexate? no  Current Complaints / other details:  Recommendation is for external beam radiation and vaginal brachytherapy.  Patient is here with her  friend.  BP 130/74 (BP Location: Right Arm, Patient Position: Sitting)   Pulse 66   Temp 97.9 F (36.6 C) (Oral)   Ht 5\' 4"  (1.626 m)   Wt 127 lb 12.8 oz (58 kg)   SpO2 98%   BMI 21.94 kg/m    Wt Readings from Last 3 Encounters:  10/17/16 127 lb 12.8 oz (58 kg)  10/12/16 129 lb 14.4 oz (58.9 kg)  12/26/15 127 lb 4.8 oz (57.7 kg)

## 2016-10-17 ENCOUNTER — Ambulatory Visit
Admission: RE | Admit: 2016-10-17 | Discharge: 2016-10-17 | Disposition: A | Discharge status: Home / Self Care | Payer: BLUE CROSS/BLUE SHIELD | Source: Ambulatory Visit | Attending: Radiation Oncology | Admitting: Radiation Oncology

## 2016-10-17 ENCOUNTER — Encounter: Payer: Self-pay | Admitting: Radiation Oncology

## 2016-10-17 VITALS — BP 130/74 | HR 66 | Temp 97.9°F | Ht 64.0 in | Wt 127.8 lb

## 2016-10-17 DIAGNOSIS — C7982 Secondary malignant neoplasm of genital organs: Secondary | ICD-10-CM | POA: Diagnosis present

## 2016-10-17 DIAGNOSIS — Z510 Encounter for antineoplastic radiation therapy: Secondary | ICD-10-CM | POA: Insufficient documentation

## 2016-10-17 DIAGNOSIS — C541 Malignant neoplasm of endometrium: Secondary | ICD-10-CM | POA: Insufficient documentation

## 2016-10-17 NOTE — Progress Notes (Signed)
Please see the Nurse Progress Note in the MD Initial Consult Encounter for this patient. 

## 2016-10-17 NOTE — Progress Notes (Signed)
Radiation Oncology         [phone] ________________________________  Initial Outpatient Consultation  Name: Erin Warner MRN: [id number]  Date: 10/17/2016  DOB: 1956/06/03  CC:No PCP Per Patient  Erin Amber, MD   REFERRING PHYSICIAN: [name], MD  DIAGNOSIS: Recurrent stage IA , grade 2 endometrioid endometrial cancer  HISTORY OF PRESENT ILLNESS::Erin Warner is a 61 y.o. female who was diagnosed with grade 2 endometrial cancer on 11/18/15. She underwent a robotic assisted total hysterectomy, BSO, SLN biopsy on [id number] without complications. She was seen by Dr. Hulan Fray for follow-up on 09/28/16. Two 5-7 mm nodules were seen in the 9 o'clock and 12 o'clock region of the vaginal cuff. These areas were biopsied and pathology confirmed recurrence of endometrioid adenocarcinoma. CT scan of the chest, abdomen, and pelvis on 10/09/16 showed no evidence of metastatic disease in the abdomen or pelvis. A solitary 4 mm LUL pulmonary nodule was noted in the chest, favored benign.  The patient denies weight changes, bowel/bladder complaints, nausea/vomiting, or pain at this time.  No reports of vaginal bleeding.  PREVIOUS RADIATION THERAPY: No  PAST MEDICAL HISTORY:  has a past medical history of Cancer (Erin Warner).    PAST SURGICAL HISTORY: Past Surgical History:  Procedure Laterality Date  . basal cell removal  2016   back  . BUNIONECTOMY  2009  . ROBOTIC ASSISTED TOTAL HYSTERECTOMY N/A 12/01/2015   Procedure: XI ROBOTIC ASSISTED TOTAL HYSTERECTOMY, BILATERAL SALPINGECTOMY OOPHORECTOMY WITH SENTINAL LYMPH NODE BIOPSY;  Surgeon: Erin Amber, MD;  Location: [facility];  Service: Gynecology;  Laterality: N/A;    FAMILY HISTORY: family history includes Cancer (age of onset: 39) in her mother; Heart disease in her father.  SOCIAL HISTORY:  reports that she has never smoked. She has never used smokeless tobacco. She reports that she does not drink alcohol or use drugs.  ALLERGIES: Patient  has no known allergies.  MEDICATIONS:  Current Outpatient Prescriptions  Medication Sig Dispense Refill  . acetaminophen (TYLENOL) 500 MG tablet Take 500 mg by mouth every 6 (six) hours as needed for headache. Reported on 11/18/2015    . Multiple Vitamin (MULTIVITAMIN) tablet Take 1 tablet by mouth daily.     No current facility-administered medications for this encounter.     REVIEW OF SYSTEMS:  A 12 point review of systems is documented in the electronic medical record. This was obtained by the nursing staff. However, I reviewed this with the patient to discuss relevant findings and make appropriate changes.  Pertinent findings noted in HPI    PHYSICAL EXAM:  height is 5\' 4"  (1.626 m) and weight is 127 lb 12.8 oz (58 kg). Her oral temperature is 97.9 F (36.6 C). Her blood pressure is 130/74 and her pulse is 66. Her oxygen saturation is 98%.   General: Alert and oriented, in no acute distress HEENT: Head is normocephalic. Extraocular movements are intact. Oropharynx is clear. Neck: Neck is supple, no palpable cervical or supraclavicular lymphadenopathy. Heart: Regular in rate and rhythm with no murmurs, rubs, or gallops. Chest: Clear to auscultation bilaterally, with no rhonchi, wheezes, or rales. Abdomen: Soft, nontender, nondistended, with no rigidity or guarding. Small scars in abdominal area from laparoscopic procedure Extremities: No cyanosis or edema. Lymphatics: see Neck Exam Skin: No concerning lesions. Musculoskeletal: symmetric strength and muscle tone throughout. Neurologic: Cranial nerves II through XII are grossly intact. No obvious focalities. Speech is fluent. Coordination is intact. Psychiatric: Judgment and insight are intact. Affect is appropriate. On pelvic  examination there was no inguinal adenopathy and the external genitalia were unremarkable. A speculum exam was performed. There was some erythema to the right vaginal cuff region from likely biopsy, and there was a  small area of granular tissue in mid vaginal cuff likely from other biopsy site. No visual tumor noted. On bimanual  examination there were no pelvic masses appreciated. Rectal exam was not repeated in light of exam performed by Dr. Denman George last week.   ECOG = 1  LABORATORY DATA:  Lab Results  Component Value Date   WBC 6.7 12/02/2015   HGB 12.5 12/02/2015   HCT 36.2 12/02/2015   MCV 82.5 12/02/2015   PLT 241 12/02/2015   NEUTROABS 3.7 11/28/2015   Lab Results  Component Value Date   NA 144 10/08/2016   K 4.6 10/08/2016   CL 105 12/02/2015   CO2 27 10/08/2016   GLUCOSE 95 10/08/2016   CREATININE 0.8 10/08/2016   CALCIUM 9.7 10/08/2016      RADIOGRAPHY: Ct Chest W Contrast  Result Date: 10/10/2016 CLINICAL DATA:  61 year old female with endometrial cancer diagnosed February 2017 status post TAHBSO 12/01/2015, with biopsy-proven cuff recurrence, presenting for restaging. EXAM: CT CHEST, ABDOMEN, AND PELVIS WITH CONTRAST TECHNIQUE: Multidetector CT imaging of the chest, abdomen and pelvis was performed following the standard protocol during bolus administration of intravenous contrast. CONTRAST:  100 cc ISOVUE-300 IOPAMIDOL (ISOVUE-300) INJECTION 61% COMPARISON:  11/10/2015 CT abdomen/ pelvis. 11/28/2015 chest radiograph. FINDINGS: CT CHEST FINDINGS Cardiovascular: Normal heart size. No significant pericardial fluid/thickening. Great vessels are normal in course and caliber. No central pulmonary emboli. Mediastinum/Nodes: No discrete thyroid nodules. Unremarkable esophagus. No pathologically enlarged axillary, mediastinal or hilar lymph nodes. Lungs/Pleura: No pneumothorax. No pleural effusion. There is a solid 4 mm left upper lobe pulmonary nodule (series 7/ image 31). No acute consolidative airspace disease, lung masses or additional significant pulmonary nodules. The previously described 8 mm peripheral left lower lobe nodular opacity on the 11/10/2015 CT abdomen/pelvis study has resolved.  Musculoskeletal: No aggressive appearing focal osseous lesions. Mild thoracic spondylosis. CT ABDOMEN PELVIS FINDINGS Hepatobiliary: Normal liver size. There are 2 simple liver cysts, largest 1.4 cm in the medial segment left liver lobe. There are several additional subcentimeter hypodense liver lesions, which are too small to characterize and unchanged since 11/10/2015, suggesting benign lesions. No new liver lesions. Normal gallbladder with no radiopaque cholelithiasis. No biliary ductal dilatation. Pancreas: Normal, with no mass or duct dilation. Spleen: Normal size. No mass. Adrenals/Urinary Tract: Normal adrenals. Normal kidneys with no hydronephrosis and no renal mass. Normal bladder. Stomach/Bowel: Grossly normal stomach. Normal caliber small bowel with no small bowel wall thickening. Normal appendix . Normal large bowel with no diverticulosis, large bowel wall thickening or pericolonic fat stranding. Vascular/Lymphatic: Normal caliber abdominal aorta. Patent portal, splenic, hepatic and renal veins. No pathologically enlarged lymph nodes in the abdomen or pelvis. Reproductive: Status post hysterectomy, with no evidence of a mass or fluid collection at the vaginal cuff. No adnexal mass. Other: No pneumoperitoneum, ascites or focal fluid collection. Musculoskeletal: No aggressive appearing focal osseous lesions. Moderate lumbar spondylosis, most prominent at L4-5. IMPRESSION: 1. No discrete vaginal cuff mass or other findings of local tumor recurrence in the pelvis. 2. No evidence of metastatic disease in the abdomen or pelvis. No abdominopelvic adenopathy. 3. Solitary 4 mm left upper lobe pulmonary nodule, indeterminate for metastasis, although more likely benign given upper lobe location. Recommend attention on a follow-up chest CT in 3 months. Electronically Signed  By: Ilona Sorrel M.D.   On: 10/10/2016 08:29   Ct Abdomen Pelvis W Contrast  Result Date: 10/10/2016 CLINICAL DATA:  61 year old female  with endometrial cancer diagnosed February 2017 status post TAHBSO 12/01/2015, with biopsy-proven cuff recurrence, presenting for restaging. EXAM: CT CHEST, ABDOMEN, AND PELVIS WITH CONTRAST TECHNIQUE: Multidetector CT imaging of the chest, abdomen and pelvis was performed following the standard protocol during bolus administration of intravenous contrast. CONTRAST:  100 cc ISOVUE-300 IOPAMIDOL (ISOVUE-300) INJECTION 61% COMPARISON:  11/10/2015 CT abdomen/ pelvis. 11/28/2015 chest radiograph. FINDINGS: CT CHEST FINDINGS Cardiovascular: Normal heart size. No significant pericardial fluid/thickening. Great vessels are normal in course and caliber. No central pulmonary emboli. Mediastinum/Nodes: No discrete thyroid nodules. Unremarkable esophagus. No pathologically enlarged axillary, mediastinal or hilar lymph nodes. Lungs/Pleura: No pneumothorax. No pleural effusion. There is a solid 4 mm left upper lobe pulmonary nodule (series 7/ image 31). No acute consolidative airspace disease, lung masses or additional significant pulmonary nodules. The previously described 8 mm peripheral left lower lobe nodular opacity on the 11/10/2015 CT abdomen/pelvis study has resolved. Musculoskeletal: No aggressive appearing focal osseous lesions. Mild thoracic spondylosis. CT ABDOMEN PELVIS FINDINGS Hepatobiliary: Normal liver size. There are 2 simple liver cysts, largest 1.4 cm in the medial segment left liver lobe. There are several additional subcentimeter hypodense liver lesions, which are too small to characterize and unchanged since 11/10/2015, suggesting benign lesions. No new liver lesions. Normal gallbladder with no radiopaque cholelithiasis. No biliary ductal dilatation. Pancreas: Normal, with no mass or duct dilation. Spleen: Normal size. No mass. Adrenals/Urinary Tract: Normal adrenals. Normal kidneys with no hydronephrosis and no renal mass. Normal bladder. Stomach/Bowel: Grossly normal stomach. Normal caliber small bowel  with no small bowel wall thickening. Normal appendix . Normal large bowel with no diverticulosis, large bowel wall thickening or pericolonic fat stranding. Vascular/Lymphatic: Normal caliber abdominal aorta. Patent portal, splenic, hepatic and renal veins. No pathologically enlarged lymph nodes in the abdomen or pelvis. Reproductive: Status post hysterectomy, with no evidence of a mass or fluid collection at the vaginal cuff. No adnexal mass. Other: No pneumoperitoneum, ascites or focal fluid collection. Musculoskeletal: No aggressive appearing focal osseous lesions. Moderate lumbar spondylosis, most prominent at L4-5. IMPRESSION: 1. No discrete vaginal cuff mass or other findings of local tumor recurrence in the pelvis. 2. No evidence of metastatic disease in the abdomen or pelvis. No abdominopelvic adenopathy. 3. Solitary 4 mm left upper lobe pulmonary nodule, indeterminate for metastasis, although more likely benign given upper lobe location. Recommend attention on a follow-up chest CT in 3 months. Electronically Signed   By: Ilona Sorrel M.D.   On: 10/10/2016 08:29      IMPRESSION: Recurrent stage IA , grade 2 endometrioid endometrial cancer. Patient developed early recurrence at the vaginal cuff (low volume). Recent chest, abdomen, and pelvic CT scan shows no other  areas of recurrence. Patient would be a good candidate for salvage radiation therapy including external beam and vaginal brachytherapy.  I anticipate 25 external treatments and 4 internal treatments. The patient's case was presented at  [facility] earlier this week and this was the consensus; since there was low volume recurrence systemic radiosensitizating chemotherapy was not recommended. I discussed the course of treatments, side effects, and potential toxicities with the patient and her friend. She appears to understand and wishes to proceed with treatment. A consent form was signed and a copy was placed in the  patient's chart.  PLAN:  CT simulation and treatment planning was scheduled for  10/23/16 at 1 pm. She will receive 5 weeks of external beam radiation followed by 4 intracavitary insertions with Iridium 192.  I am recommending IMRT for the external beam treatments to limit dose to the small bowel.  I spent 60 minutes minutes face to face with the patient and more than 50% of that time was spent in counseling and/or coordination of care.   ------------------------------------------------  Blair Promise, PhD, MD  This document serves as a record of services personally performed by Gery Pray, MD. It was created on his behalf by Bethann Humble, a trained medical scribe. The creation of this record is based on the scribe's personal observations and the provider's statements to them. This document has been checked and approved by the attending provider.

## 2016-10-19 ENCOUNTER — Ambulatory Visit: Payer: BLUE CROSS/BLUE SHIELD

## 2016-10-22 NOTE — Progress Notes (Addendum)
Does patient have an allergy to IV contrast dye?: No.   Has patient ever received premedication for IV contrast dye?: No.   Does patient take metformin?: No.  If patient does take metformin when was the last dose: n/a  Date of lab work: October 08, 2016 BUN: 13.7  CR: 0.8  IV site: right ac - removed intact.  Pressure and band aid applied.  BP 122/80 (BP Location: Right Arm, Patient Position: Sitting)   Pulse 62   Temp 97.7 F (36.5 C) (Oral)   Ht 5\' 4"  (1.626 m)   Wt 127 lb 9.6 oz (57.9 kg)   SpO2 100%   BMI 21.90 kg/m

## 2016-10-23 ENCOUNTER — Ambulatory Visit
Admission: RE | Admit: 2016-10-23 | Discharge: 2016-10-23 | Disposition: A | Discharge status: Home / Self Care | Payer: BLUE CROSS/BLUE SHIELD | Source: Ambulatory Visit | Attending: Radiation Oncology | Admitting: Radiation Oncology

## 2016-10-23 VITALS — BP 122/80 | HR 62 | Temp 97.7°F | Ht 64.0 in | Wt 127.6 lb

## 2016-10-23 DIAGNOSIS — C7982 Secondary malignant neoplasm of genital organs: Secondary | ICD-10-CM

## 2016-10-23 DIAGNOSIS — Z510 Encounter for antineoplastic radiation therapy: Secondary | ICD-10-CM | POA: Diagnosis not present

## 2016-10-23 MED ORDER — SODIUM CHLORIDE 0.9 % IJ SOLN
10.0000 mL | Freq: Once | INTRAMUSCULAR | Status: AC
  Administered 2016-10-23: 10 mL via INTRAVENOUS

## 2016-10-28 NOTE — Progress Notes (Signed)
  Radiation Oncology         [phone]) [phone] ________________________________  Name: Erin Warner MRN: [id number]  Date: 10/23/2016  DOB: 01/27/1956  SIMULATION AND TREATMENT PLANNING NOTE    ICD-9-CM ICD-10-CM   1. 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  Identity was confirmed.  All relevant records and images related to the planned course of therapy were reviewed.  The patient freely provided informed written consent to proceed with treatment after reviewing the details related to the planned course of therapy. The consent form was witnessed and verified by the simulation staff.  Then, the patient was set-up in a stable reproducible  supine position for radiation therapy.  CT images were obtained.  Surface markings were placed.  The CT images were loaded into the planning software.  Then the target and avoidance structures were contoured.  Treatment planning then occurred.  The radiation prescription was entered and confirmed.  Then, I designed and supervised the construction of a total of 2 medically necessary complex treatment devices.  I have requested : Intensity Modulated Radiotherapy (IMRT) is medically necessary for this case for the following reason:  Small bowel sparing..  I have ordered:dose calc.  PLAN:  The patient will receive 45 Gy in 25 fractions followed by intracavitary brachytherapy treatments directed at  the vaginal cuff of 24 Gy using iridium 192 as the high-dose-rate source (4 HDR treatments).  -----------------------------------  Blair Promise, PhD, MD

## 2016-11-01 ENCOUNTER — Ambulatory Visit
Admission: RE | Admit: 2016-11-01 | Discharge: 2016-11-01 | Disposition: A | Discharge status: Home / Self Care | Payer: BLUE CROSS/BLUE SHIELD | Source: Ambulatory Visit | Attending: Radiation Oncology | Admitting: Radiation Oncology

## 2016-11-01 ENCOUNTER — Ambulatory Visit
Admission: RE | Admit: 2016-11-01 | Payer: BLUE CROSS/BLUE SHIELD | Source: Ambulatory Visit | Admitting: Radiation Oncology

## 2016-11-01 DIAGNOSIS — Z510 Encounter for antineoplastic radiation therapy: Secondary | ICD-10-CM | POA: Diagnosis not present

## 2016-11-01 DIAGNOSIS — C7982 Secondary malignant neoplasm of genital organs: Secondary | ICD-10-CM

## 2016-11-02 ENCOUNTER — Ambulatory Visit: Payer: BLUE CROSS/BLUE SHIELD | Admitting: Radiation Oncology

## 2016-11-02 ENCOUNTER — Ambulatory Visit: Payer: BLUE CROSS/BLUE SHIELD

## 2016-11-04 NOTE — Progress Notes (Signed)
  Radiation Oncology         [phone]) [phone] ________________________________  Name: Erin Warner MRN: [id number]  Date: 11/01/2016  DOB: 1956-05-07  SIMULATION AND TREATMENT PLANNING NOTE    ICD-9-CM ICD-10-CM   1. 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  Identity was confirmed.  All relevant records and images related to the planned course of therapy were reviewed.  The patient freely provided informed written consent to proceed with treatment after reviewing the details related to the planned course of therapy. The consent form was witnessed and verified by the simulation staff.  Then, the patient was set-up in a stable reproducible  supine position for radiation therapy.  CT images were obtained.  Surface markings were placed.  The CT images were loaded into the planning software.  Then the target and avoidance structures were contoured.  Treatment planning then occurred.  The radiation prescription was entered and confirmed.  Then, I designed and supervised the construction of a total of 2 medically necessary complex treatment devices.  I have requested : Intensity Modulated Radiotherapy (IMRT) is medically necessary for this case for the following reason:  Small bowel sparing..  I have ordered:CBC  PLAN:  The patient will receive 45 Gy in 25 fractions followed by 4 intracavitary treatments directed at the proximal vagina using iridium 192 as the high-dose-rate source.  Today the patient underwent re-simulation. Her cone beam images did not line up well with her planning images. The patient's rectal air with significantly decreased on the patient's cone beam imaging. In light of these findings the patient underwent re-simulation and will undergo replanning to begin her treatments early next  week.  -----------------------------------  Blair Promise, PhD, MD

## 2016-11-05 ENCOUNTER — Ambulatory Visit
Admission: RE | Admit: 2016-11-05 | Discharge: 2016-11-05 | Disposition: A | Discharge status: Home / Self Care | Payer: BLUE CROSS/BLUE SHIELD | Source: Ambulatory Visit | Attending: Radiation Oncology | Admitting: Radiation Oncology

## 2016-11-05 DIAGNOSIS — C7982 Secondary malignant neoplasm of genital organs: Secondary | ICD-10-CM

## 2016-11-05 DIAGNOSIS — Z510 Encounter for antineoplastic radiation therapy: Secondary | ICD-10-CM | POA: Diagnosis not present

## 2016-11-05 NOTE — Progress Notes (Signed)
  Radiation Oncology         [phone]) [phone] ________________________________  Name: Erin Warner MRN: [id number]  Date: 11/05/2016  DOB: [birth year]  Simulation Verification Note    ICD-9-CM ICD-10-CM   1. 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 The clinical setup was verified. Then port films were obtained and uploaded to the radiation oncology medical record software.  The treatment beams were carefully compared against the planned radiation fields. The position location and shape of the radiation fields was reviewed. They targeted volume of tissue appears to be appropriately covered by the radiation beams. Organs at risk appear to be excluded as planned.  Based on my personal review, I approved the simulation verification. The patient's treatment will proceed as planned.  -----------------------------------  Blair Promise, PhD, MD

## 2016-11-06 ENCOUNTER — Ambulatory Visit
Admission: RE | Admit: 2016-11-06 | Discharge: 2016-11-06 | Disposition: A | Discharge status: Home / Self Care | Payer: BLUE CROSS/BLUE SHIELD | Source: Ambulatory Visit | Attending: Radiation Oncology | Admitting: Radiation Oncology

## 2016-11-06 ENCOUNTER — Encounter: Payer: Self-pay | Admitting: Radiation Oncology

## 2016-11-06 VITALS — BP 122/71 | HR 60 | Temp 97.7°F | Resp 16 | Ht 64.0 in | Wt 124.7 lb

## 2016-11-06 DIAGNOSIS — Z510 Encounter for antineoplastic radiation therapy: Secondary | ICD-10-CM | POA: Diagnosis not present

## 2016-11-06 DIAGNOSIS — C7982 Secondary malignant neoplasm of genital organs: Secondary | ICD-10-CM

## 2016-11-06 NOTE — Progress Notes (Signed)
Mrs. Erin Warner has received 2 radiations to pelvis.  Patient teaching reviewed  areas of pertinence such as fatigue, hair loss, skin changes, nausea, vomiting,urinary bladder changes.  Pt able to give teach back of to pat skin, use unscented/gentle soap, and drink plenty of water. Pt demonstrated understanding of information given and will contact nursing with any questions or concerns.   Denies any of the mentioned areas of pertinence as concerns.  Wt Readings from Last 3 Encounters:  11/06/16 124 lb 11.2 oz (56.6 kg)  10/23/16 127 lb 9.6 oz (57.9 kg)  10/17/16 127 lb 12.8 oz (58 kg)  .BP 122/71   Pulse 60   Temp 97.7 F (36.5 C) (Oral)   Resp 16   Ht 5\' 4"  (1.626 m)   Wt 124 lb 11.2 oz (56.6 kg)   SpO2 100%   BMI 21.40 kg/m

## 2016-11-06 NOTE — Progress Notes (Signed)
  Radiation Oncology         [phone]) [phone] ________________________________  Name: Erin Warner MRN: [id number]  Date: 11/06/2016  DOB: 1955-11-18  Weekly Radiation Therapy Management    ICD-9-CM ICD-10-CM   1. 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 . . . . . . . The patient presents for routine under treatment assessment.                                    Erin Warner has received 2 radiation txs to pelvis. The nurse performed patient education and the patient does not have any complaints at this time.                                  Set-up films were reviewed.                                 The chart was checked. Physical Findings. . .  height is 5\' 4"  (1.626 m) and weight is 124 lb 11.2 oz (56.6 kg). Her oral temperature is 97.7 F (36.5 C). Her blood pressure is 122/71 and her pulse is 60. Her respiration is 16 and oxygen saturation is 100%.  Lungs are clear to auscultation bilaterally. Heart has regular rate and rhythm. Abdomen soft, non-tender, normal bowel sounds. Impression . . . . . . . The patient is tolerating radiation. Plan . . . . . . . . . . . . Continue treatment as planned. I advised the patient to have a full bladder during treatment to help keep the bowel out of the treatment field.  ________________________________   Blair Promise, PhD, MD  This document serves as a record of services personally performed by Gery Pray, MD. It was created on his behalf by Darcus Austin, a trained medical scribe. The creation of this record is based on the scribe's personal observations and the provider's statements to them. This document has been checked and approved by the attending provider.

## 2016-11-07 ENCOUNTER — Ambulatory Visit
Admission: RE | Admit: 2016-11-07 | Discharge: 2016-11-07 | Disposition: A | Discharge status: Home / Self Care | Payer: BLUE CROSS/BLUE SHIELD | Source: Ambulatory Visit | Attending: Radiation Oncology | Admitting: Radiation Oncology

## 2016-11-07 DIAGNOSIS — Z510 Encounter for antineoplastic radiation therapy: Secondary | ICD-10-CM | POA: Diagnosis not present

## 2016-11-08 ENCOUNTER — Ambulatory Visit
Admission: RE | Admit: 2016-11-08 | Discharge: 2016-11-08 | Disposition: A | Discharge status: Home / Self Care | Payer: BLUE CROSS/BLUE SHIELD | Source: Ambulatory Visit | Attending: Radiation Oncology | Admitting: Radiation Oncology

## 2016-11-08 DIAGNOSIS — Z510 Encounter for antineoplastic radiation therapy: Secondary | ICD-10-CM | POA: Diagnosis not present

## 2016-11-09 ENCOUNTER — Telehealth: Payer: Self-pay | Admitting: *Deleted

## 2016-11-09 ENCOUNTER — Ambulatory Visit
Admission: RE | Admit: 2016-11-09 | Discharge: 2016-11-09 | Disposition: A | Discharge status: Home / Self Care | Payer: BLUE CROSS/BLUE SHIELD | Source: Ambulatory Visit | Attending: Radiation Oncology | Admitting: Radiation Oncology

## 2016-11-09 DIAGNOSIS — Z510 Encounter for antineoplastic radiation therapy: Secondary | ICD-10-CM | POA: Diagnosis not present

## 2016-11-09 NOTE — Telephone Encounter (Signed)
Spoke with pt and she is in complete agreement with Dr. Woodward Ku recommendations.  Her last tx will be mid March.  Recall put in for mid June, but pt will make sure she has checked with her oncologist when she receives her recall appointment letter

## 2016-11-09 NOTE — Telephone Encounter (Signed)
Please ask patient to reschudle the procedure, contact us 3-6 months after completion. Thanks

## 2016-11-09 NOTE — Telephone Encounter (Signed)
Dr. Silverio Decamp,  This pt is currently being tx for endometrial CA.  Per Dr. Ardis Hughs, we are supposed to check if the pt is being tx currently or within the last year for cancer, if you'd like to proceed with scheduled colonoscopy.    Please advise, Cyril Mourning

## 2016-11-12 ENCOUNTER — Ambulatory Visit
Admission: RE | Admit: 2016-11-12 | Discharge: 2016-11-12 | Disposition: A | Discharge status: Home / Self Care | Payer: BLUE CROSS/BLUE SHIELD | Source: Ambulatory Visit | Attending: Radiation Oncology | Admitting: Radiation Oncology

## 2016-11-12 DIAGNOSIS — Z510 Encounter for antineoplastic radiation therapy: Secondary | ICD-10-CM | POA: Diagnosis not present

## 2016-11-13 ENCOUNTER — Ambulatory Visit
Admission: RE | Admit: 2016-11-13 | Discharge: 2016-11-13 | Disposition: A | Discharge status: Home / Self Care | Payer: BLUE CROSS/BLUE SHIELD | Source: Ambulatory Visit | Attending: Radiation Oncology | Admitting: Radiation Oncology

## 2016-11-13 ENCOUNTER — Encounter: Payer: Self-pay | Admitting: Radiation Oncology

## 2016-11-13 VITALS — BP 102/57 | HR 68 | Temp 98.0°F | Ht 64.0 in | Wt 122.4 lb

## 2016-11-13 DIAGNOSIS — Z510 Encounter for antineoplastic radiation therapy: Secondary | ICD-10-CM | POA: Diagnosis not present

## 2016-11-13 DIAGNOSIS — C7982 Secondary malignant neoplasm of genital organs: Secondary | ICD-10-CM

## 2016-11-13 NOTE — Progress Notes (Signed)
  Radiation Oncology         [phone]) [phone] ________________________________  Name: Erin Warner MRN: [id number]  Date: 11/13/2016  DOB: March 14, 1956  Weekly Radiation Therapy Management    ICD-9-CM ICD-10-CM   1. 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 . . . . . . . The patient presents for routine under treatment assessment.                                    The patient has completed 7 fractions to her pelvis. She denies having pain, nausea, or vaginal bleeding. She reports having diarrhea once yesterday, sensitivity when wiping after urinating, and mild fatigue.                                  Set-up films were reviewed.                                 The chart was checked. Physical Findings. . .  height is 5\' 4"  (1.626 m) and weight is 122 lb 6.4 oz (55.5 kg). Her oral temperature is 98 F (36.7 C). Her blood pressure is 102/57 (abnormal) and her pulse is 68. Her oxygen saturation is 100%.  Lungs are clear to auscultation bilaterally. Heart has regular rate and rhythm. Abdomen soft, non-tender, normal bowel sounds. Impression . . . . . . . The patient is tolerating radiation. Plan . . . . . . . . . . . . Continue treatment as planned. ________________________________   Blair Promise, PhD, MD  This document serves as a record of services personally performed by Gery Pray, MD. It was created on his behalf by Darcus Austin, a trained medical scribe. The creation of this record is based on the scribe's personal observations and the provider's statements to them. This document has been checked and approved by the attending provider.

## 2016-11-13 NOTE — Progress Notes (Signed)
Erin Warner has completed 7 fractions to her pelvis.  She denies having pain.  She reports having diarrhea once yesterday.  She denies having nausea. She reports having sensitivity when wiping after urinating.  She denies having any vaginal bleeding.  She reports having some fatigue.  BP (!) 102/57 (BP Location: Left Arm, Patient Position: Sitting)   Pulse 68   Temp 98 F (36.7 C) (Oral)   Ht 5\' 4"  (1.626 m)   Wt 122 lb 6.4 oz (55.5 kg)   SpO2 100%   BMI 21.01 kg/m    Wt Readings from Last 3 Encounters:  11/13/16 122 lb 6.4 oz (55.5 kg)  11/06/16 124 lb 11.2 oz (56.6 kg)  10/23/16 127 lb 9.6 oz (57.9 kg)

## 2016-11-14 ENCOUNTER — Ambulatory Visit
Admission: RE | Admit: 2016-11-14 | Discharge: 2016-11-14 | Disposition: A | Discharge status: Home / Self Care | Payer: BLUE CROSS/BLUE SHIELD | Source: Ambulatory Visit | Attending: Radiation Oncology | Admitting: Radiation Oncology

## 2016-11-14 DIAGNOSIS — Z510 Encounter for antineoplastic radiation therapy: Secondary | ICD-10-CM | POA: Diagnosis not present

## 2016-11-15 ENCOUNTER — Ambulatory Visit
Admission: RE | Admit: 2016-11-15 | Discharge: 2016-11-15 | Disposition: A | Discharge status: Home / Self Care | Payer: BLUE CROSS/BLUE SHIELD | Source: Ambulatory Visit | Attending: Radiation Oncology | Admitting: Radiation Oncology

## 2016-11-15 DIAGNOSIS — Z510 Encounter for antineoplastic radiation therapy: Secondary | ICD-10-CM | POA: Diagnosis not present

## 2016-11-16 ENCOUNTER — Ambulatory Visit
Admission: RE | Admit: 2016-11-16 | Discharge: 2016-11-16 | Disposition: A | Discharge status: Home / Self Care | Payer: BLUE CROSS/BLUE SHIELD | Source: Ambulatory Visit | Attending: Radiation Oncology | Admitting: Radiation Oncology

## 2016-11-16 DIAGNOSIS — Z510 Encounter for antineoplastic radiation therapy: Secondary | ICD-10-CM | POA: Diagnosis not present

## 2016-11-19 ENCOUNTER — Ambulatory Visit
Admission: RE | Admit: 2016-11-19 | Discharge: 2016-11-19 | Disposition: A | Discharge status: Home / Self Care | Payer: BLUE CROSS/BLUE SHIELD | Source: Ambulatory Visit | Attending: Radiation Oncology | Admitting: Radiation Oncology

## 2016-11-19 DIAGNOSIS — Z510 Encounter for antineoplastic radiation therapy: Secondary | ICD-10-CM | POA: Diagnosis not present

## 2016-11-20 ENCOUNTER — Encounter: Payer: Self-pay | Admitting: Radiation Oncology

## 2016-11-20 ENCOUNTER — Ambulatory Visit
Admission: RE | Admit: 2016-11-20 | Discharge: 2016-11-20 | Disposition: A | Discharge status: Home / Self Care | Payer: BLUE CROSS/BLUE SHIELD | Source: Ambulatory Visit | Attending: Radiation Oncology | Admitting: Radiation Oncology

## 2016-11-20 VITALS — BP 111/69 | HR 59 | Temp 97.5°F | Ht 64.0 in | Wt 126.8 lb

## 2016-11-20 DIAGNOSIS — C7982 Secondary malignant neoplasm of genital organs: Secondary | ICD-10-CM

## 2016-11-20 DIAGNOSIS — C541 Malignant neoplasm of endometrium: Secondary | ICD-10-CM

## 2016-11-20 DIAGNOSIS — Z510 Encounter for antineoplastic radiation therapy: Secondary | ICD-10-CM | POA: Diagnosis not present

## 2016-11-20 NOTE — Progress Notes (Signed)
  Radiation Oncology         [phone]) [phone] ________________________________  Name: Erin Warner MRN: [id number]  Date: 11/20/2016  DOB: 1956/07/18  Weekly Radiation Therapy Management    ICD-9-CM ICD-10-CM   1. 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 Endometrial carcinoma (HCC) 182.0 C54.1      Current Dose: 21.6 Gy     Planned Dose:  45+ Gy  Narrative . . . . . . . . The patient presents for routine under treatment assessment.                                    Erin Warner has completed 12 fractions to her pelvis. She denies pain, but reports her pelvic area aches. She denies having any bladder issues or vaginal bleeding. She reports having diarrhea once and then had some small bowel movements and gas through out the day. She thinks this was due to her diet. She has not used Imodium yet. She reports having a good energy level, but takes occasional naps. Reports loss of appetite at times.                                  Set-up films were reviewed.                                 The chart was checked. Physical Findings. . .  height is 5\' 4"  (1.626 m) and weight is 126 lb 12.8 oz (57.5 kg). Her oral temperature is 97.5 F (36.4 C). Her blood pressure is 111/69 and her pulse is 59 (abnormal). Her oxygen saturation is 100%.  Lungs are clear to auscultation bilaterally. Heart has regular rate and rhythm. Abdomen soft, non-tender, normal bowel sounds. Impression . . . . . . . The patient is tolerating radiation. Plan . . . . . . . . . . . . Continue treatment as planned. ________________________________   Blair Promise, PhD, MD  This document serves as a record of services personally performed by Gery Pray, MD. It was created on his behalf by Darcus Austin, a trained medical scribe. The creation of this record is based on the scribe's personal observations and the provider's statements to them. This document has been checked and approved by the attending  provider.

## 2016-11-20 NOTE — Progress Notes (Signed)
Rozenia has completed 12 fractions to her pelvis.  She denies having pain but has had aching in her pelvic area.  She denies having any bladder issues.  She reports having diarrhea once and then had some small bowel movements and gas through out the day.  She thinks this was due to her diet.  She has not used Imodium yet. She denies having any vaginal bleeding.  She reports having a good energy level.  BP 111/69 (BP Location: Right Arm, Patient Position: Sitting)   Pulse (!) 59   Temp 97.5 F (36.4 C) (Oral)   Ht 5\' 4"  (1.626 m)   Wt 126 lb 12.8 oz (57.5 kg)   SpO2 100%   BMI 21.77 kg/m    Wt Readings from Last 3 Encounters:  11/20/16 126 lb 12.8 oz (57.5 kg)  11/13/16 122 lb 6.4 oz (55.5 kg)  11/06/16 124 lb 11.2 oz (56.6 kg)

## 2016-11-21 ENCOUNTER — Ambulatory Visit
Admission: RE | Admit: 2016-11-21 | Discharge: 2016-11-21 | Disposition: A | Discharge status: Home / Self Care | Payer: BLUE CROSS/BLUE SHIELD | Source: Ambulatory Visit | Attending: Radiation Oncology | Admitting: Radiation Oncology

## 2016-11-21 ENCOUNTER — Ambulatory Visit: Payer: BLUE CROSS/BLUE SHIELD

## 2016-11-21 DIAGNOSIS — Z510 Encounter for antineoplastic radiation therapy: Secondary | ICD-10-CM | POA: Diagnosis not present

## 2016-11-22 ENCOUNTER — Ambulatory Visit
Admission: RE | Admit: 2016-11-22 | Discharge: 2016-11-22 | Disposition: A | Discharge status: Home / Self Care | Payer: BLUE CROSS/BLUE SHIELD | Source: Ambulatory Visit | Attending: Radiation Oncology | Admitting: Radiation Oncology

## 2016-11-22 DIAGNOSIS — Z510 Encounter for antineoplastic radiation therapy: Secondary | ICD-10-CM | POA: Diagnosis not present

## 2016-11-23 ENCOUNTER — Ambulatory Visit
Admission: RE | Admit: 2016-11-23 | Discharge: 2016-11-23 | Disposition: A | Discharge status: Home / Self Care | Payer: BLUE CROSS/BLUE SHIELD | Source: Ambulatory Visit | Attending: Radiation Oncology | Admitting: Radiation Oncology

## 2016-11-23 DIAGNOSIS — Z510 Encounter for antineoplastic radiation therapy: Secondary | ICD-10-CM | POA: Diagnosis not present

## 2016-11-26 ENCOUNTER — Ambulatory Visit
Admission: RE | Admit: 2016-11-26 | Discharge: 2016-11-26 | Disposition: A | Discharge status: Home / Self Care | Payer: BLUE CROSS/BLUE SHIELD | Source: Ambulatory Visit | Attending: Radiation Oncology | Admitting: Radiation Oncology

## 2016-11-26 DIAGNOSIS — Z510 Encounter for antineoplastic radiation therapy: Secondary | ICD-10-CM | POA: Diagnosis not present

## 2016-11-27 ENCOUNTER — Ambulatory Visit
Admission: RE | Admit: 2016-11-27 | Discharge: 2016-11-27 | Disposition: A | Discharge status: Home / Self Care | Payer: BLUE CROSS/BLUE SHIELD | Source: Ambulatory Visit | Attending: Radiation Oncology | Admitting: Radiation Oncology

## 2016-11-27 ENCOUNTER — Encounter: Payer: Self-pay | Admitting: Radiation Oncology

## 2016-11-27 VITALS — BP 107/74 | HR 70 | Temp 97.8°F | Ht 64.0 in | Wt 124.2 lb

## 2016-11-27 DIAGNOSIS — C7982 Secondary malignant neoplasm of genital organs: Secondary | ICD-10-CM

## 2016-11-27 DIAGNOSIS — Z510 Encounter for antineoplastic radiation therapy: Secondary | ICD-10-CM | POA: Diagnosis not present

## 2016-11-27 DIAGNOSIS — C541 Malignant neoplasm of endometrium: Secondary | ICD-10-CM

## 2016-11-27 NOTE — Progress Notes (Signed)
  Radiation Oncology         [phone]) [phone] ________________________________  Name: Erin Warner MRN: [id number]  Date: 11/27/2016  DOB: 03-04-1956  Weekly Radiation Therapy Management    ICD-9-CM ICD-10-CM   1. 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 Endometrial carcinoma (HCC) 182.0 C54.1      Current Dose: 30.6 Gy     Planned Dose:  45+ Gy  Narrative . . . . . . . . The patient presents for routine under treatment assessment.                                    Erin Warner has completed 17 fractions to her pelvis.  She reports having some pain with bowel movements in her rectal area and also noticed some burning with urination today. She reports having 2 episodes of diarrhea per day and has not starting taking Imodium yet.  She reports having some pink vaginal discharge on Sunday.  She reports having occasional fatigue.  She has been given a sitz bath to try by the nurse. The patient reports a fair appetite.                                  Set-up films were reviewed.                                 The chart was checked. Physical Findings. . .  height is 5\' 4"  (1.626 m) and weight is 124 lb 3.2 oz (56.3 kg). Her oral temperature is 97.8 F (36.6 C). Her blood pressure is 107/74 and her pulse is 70. Her oxygen saturation is 100%.  Lungs are clear to auscultation bilaterally. Heart has regular rate and rhythm. Abdomen soft, non-tender, normal bowel sounds. Impression . . . . . . . The patient is tolerating radiation. Plan . . . . . . . . . . . . Continue treatment as planned. The patient will use a sitz bath and will try imodium for diarrhea. ________________________________   Blair Promise, PhD, MD  This document serves as a record of services personally performed by Gery Pray, MD. It was created on his behalf by Darcus Austin, a trained medical scribe. The creation of this record is based on the scribe's personal observations and the provider's statements  to them. This document has been checked and approved by the attending provider.

## 2016-11-27 NOTE — Progress Notes (Addendum)
Erin Warner has completed 17 fractions to her pelvis.  She reports having some pain with bowel movements in her rectal area and also noticed some burning with urination today.  She reports having 2 episodes of diarrhea per day and has not starting taking Imodium yet.  She reports having some pink vaginal discharge on Sunday.  She reports having occasional fatigue.  She has been given a sitz bath to try.  BP 107/74 (BP Location: Right Arm, Patient Position: Sitting)   Pulse 70   Temp 97.8 F (36.6 C) (Oral)   Ht 5\' 4"  (1.626 m)   Wt 124 lb 3.2 oz (56.3 kg)   SpO2 100%   BMI 21.32 kg/m    Wt Readings from Last 3 Encounters:  11/27/16 124 lb 3.2 oz (56.3 kg)  11/20/16 126 lb 12.8 oz (57.5 kg)  11/13/16 122 lb 6.4 oz (55.5 kg)

## 2016-11-28 ENCOUNTER — Ambulatory Visit
Admission: RE | Admit: 2016-11-28 | Discharge: 2016-11-28 | Disposition: A | Discharge status: Home / Self Care | Payer: BLUE CROSS/BLUE SHIELD | Source: Ambulatory Visit | Attending: Radiation Oncology | Admitting: Radiation Oncology

## 2016-11-28 DIAGNOSIS — Z510 Encounter for antineoplastic radiation therapy: Secondary | ICD-10-CM | POA: Diagnosis not present

## 2016-11-29 ENCOUNTER — Ambulatory Visit
Admission: RE | Admit: 2016-11-29 | Discharge: 2016-11-29 | Disposition: A | Discharge status: Home / Self Care | Payer: BLUE CROSS/BLUE SHIELD | Source: Ambulatory Visit | Attending: Radiation Oncology | Admitting: Radiation Oncology

## 2016-11-29 DIAGNOSIS — Z510 Encounter for antineoplastic radiation therapy: Secondary | ICD-10-CM | POA: Diagnosis not present

## 2016-11-30 ENCOUNTER — Ambulatory Visit
Admission: RE | Admit: 2016-11-30 | Discharge: 2016-11-30 | Disposition: A | Discharge status: Home / Self Care | Payer: BLUE CROSS/BLUE SHIELD | Source: Ambulatory Visit | Attending: Radiation Oncology | Admitting: Radiation Oncology

## 2016-11-30 ENCOUNTER — Encounter: Payer: BLUE CROSS/BLUE SHIELD | Admitting: Gastroenterology

## 2016-11-30 DIAGNOSIS — Z510 Encounter for antineoplastic radiation therapy: Secondary | ICD-10-CM | POA: Diagnosis not present

## 2016-12-03 ENCOUNTER — Ambulatory Visit
Admission: RE | Admit: 2016-12-03 | Discharge: 2016-12-03 | Disposition: A | Discharge status: Home / Self Care | Payer: BLUE CROSS/BLUE SHIELD | Source: Ambulatory Visit | Attending: Radiation Oncology | Admitting: Radiation Oncology

## 2016-12-03 DIAGNOSIS — Z510 Encounter for antineoplastic radiation therapy: Secondary | ICD-10-CM | POA: Diagnosis not present

## 2016-12-04 ENCOUNTER — Encounter: Payer: Self-pay | Admitting: Radiation Oncology

## 2016-12-04 ENCOUNTER — Ambulatory Visit
Admission: RE | Admit: 2016-12-04 | Discharge: 2016-12-04 | Disposition: A | Discharge status: Home / Self Care | Payer: BLUE CROSS/BLUE SHIELD | Source: Ambulatory Visit | Attending: Radiation Oncology | Admitting: Radiation Oncology

## 2016-12-04 VITALS — BP 104/67 | HR 69 | Temp 97.7°F | Ht 64.0 in | Wt 123.0 lb

## 2016-12-04 DIAGNOSIS — Z510 Encounter for antineoplastic radiation therapy: Secondary | ICD-10-CM | POA: Diagnosis not present

## 2016-12-04 DIAGNOSIS — C7982 Secondary malignant neoplasm of genital organs: Secondary | ICD-10-CM

## 2016-12-04 DIAGNOSIS — C541 Malignant neoplasm of endometrium: Secondary | ICD-10-CM

## 2016-12-04 NOTE — Progress Notes (Signed)
Erin Warner has completed 22 fractions to her pelvis.  She reports having some nausea/queziness and a reduced appetite.  She reports having diarrhea about once a day.  She tried Imodium once.  She also reports having some rectal irritation and pain with bowel movements.  She is using a sitz bath.  She is wondering if the pain is due to hemorrhoids since she is seeing a spot of blood occasionally.  She reports feeling tired.   BP 104/67 (BP Location: Left Arm, Patient Position: Sitting)   Pulse 69   Temp 97.7 F (36.5 C) (Oral)   Ht 5\' 4"  (1.626 m)   Wt 123 lb (55.8 kg)   SpO2 100%   BMI 21.11 kg/m    Wt Readings from Last 3 Encounters:  12/04/16 123 lb (55.8 kg)  11/27/16 124 lb 3.2 oz (56.3 kg)  11/20/16 126 lb 12.8 oz (57.5 kg)

## 2016-12-04 NOTE — Progress Notes (Signed)
  Radiation Oncology         [phone]) [phone] ________________________________  Name: Erin Warner MRN: [id number]  Date: 12/04/2016  DOB: January 05, 1956  Weekly Radiation Therapy Management    ICD-9-CM ICD-10-CM   1. 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 Endometrial carcinoma (HCC) 182.0 C54.1      Current Dose: 39.6 Gy     Planned Dose:  45+ Gy  Narrative . . . . . . . . The patient presents for routine under treatment assessment. Erin Warner has completed 22 fractions to her pelvis.  She reports having some nausea/queziness and a reduced appetite.  She reports having diarrhea about once a day.  She tried Imodium once.  She also reports having some rectal irritation and pain with bowel movements.  She is using a sitz bath.  She is wondering if the pain is due to hemorrhoids since she is seeing a spot of blood occasionally.  She reports feeling tired.                                                                    Set-up films were reviewed.                                 The chart was checked. Physical Findings. . .  height is 5\' 4"  (1.626 m) and weight is 123 lb (55.8 kg). Her oral temperature is 97.7 F (36.5 C). Her blood pressure is 104/67 and her pulse is 69. Her oxygen saturation is 100%.  Lungs are clear to auscultation bilaterally. Heart has regular rate and rhythm. Abdomen soft, non-tender, normal bowel sounds. Impression . . . . . . . The patient is tolerating radiation. Plan . . . . . . . . . . . . Continue treatment as planned. The patient will use a sitz bath and will try imodium for diarrhea. I also offered steroid suppositories but the patient would like to hold off on this medication. ________________________________   Blair Promise, PhD, MD  This document serves as a record of services personally performed by Gery Pray, MD. It was created on his behalf by Maryla Morrow, a trained medical scribe. The creation of this record is based on  the scribe's personal observations and the provider's statements to them. This document has been checked and approved by the attending provider.

## 2016-12-05 ENCOUNTER — Ambulatory Visit: Payer: BLUE CROSS/BLUE SHIELD

## 2016-12-05 ENCOUNTER — Telehealth: Payer: Self-pay | Admitting: *Deleted

## 2016-12-05 ENCOUNTER — Ambulatory Visit
Admission: RE | Admit: 2016-12-05 | Discharge: 2016-12-05 | Disposition: A | Discharge status: Home / Self Care | Payer: BLUE CROSS/BLUE SHIELD | Source: Ambulatory Visit | Attending: Radiation Oncology | Admitting: Radiation Oncology

## 2016-12-05 DIAGNOSIS — Z510 Encounter for antineoplastic radiation therapy: Secondary | ICD-10-CM | POA: Diagnosis not present

## 2016-12-05 NOTE — Telephone Encounter (Signed)
CALLED PATIENT TO INFORM OF [facility], LVM FOR A RETURN CALL 

## 2016-12-06 ENCOUNTER — Ambulatory Visit
Admission: RE | Admit: 2016-12-06 | Discharge: 2016-12-06 | Disposition: A | Discharge status: Home / Self Care | Payer: BLUE CROSS/BLUE SHIELD | Source: Ambulatory Visit | Attending: Radiation Oncology | Admitting: Radiation Oncology

## 2016-12-06 ENCOUNTER — Ambulatory Visit: Payer: BLUE CROSS/BLUE SHIELD

## 2016-12-06 DIAGNOSIS — Z510 Encounter for antineoplastic radiation therapy: Secondary | ICD-10-CM | POA: Diagnosis not present

## 2016-12-07 ENCOUNTER — Ambulatory Visit
Admission: RE | Admit: 2016-12-07 | Discharge: 2016-12-07 | Disposition: A | Discharge status: Home / Self Care | Payer: BLUE CROSS/BLUE SHIELD | Source: Ambulatory Visit | Attending: Radiation Oncology | Admitting: Radiation Oncology

## 2016-12-07 DIAGNOSIS — Z510 Encounter for antineoplastic radiation therapy: Secondary | ICD-10-CM | POA: Diagnosis not present

## 2016-12-10 ENCOUNTER — Telehealth: Payer: Self-pay | Admitting: Oncology

## 2016-12-10 NOTE — Telephone Encounter (Signed)
Erin Warner called and said she is having some burning with urination due to skin irritation.  Advised her to try applying Desitin to the area and to call back if that does not help.  Erin Warner verbalized understanding and agreement.

## 2016-12-12 ENCOUNTER — Telehealth: Payer: Self-pay | Admitting: *Deleted

## 2016-12-12 NOTE — Telephone Encounter (Signed)
CALLED PATIENT TO REMIND OF [facility] FOR 12-13-16, SPOKE WITH PATIENT AND SHE IS AWARE OF THESE APPTS.

## 2016-12-13 ENCOUNTER — Ambulatory Visit
Admission: RE | Admit: 2016-12-13 | Discharge: 2016-12-13 | Disposition: A | Discharge status: Home / Self Care | Payer: BLUE CROSS/BLUE SHIELD | Source: Ambulatory Visit | Attending: Radiation Oncology | Admitting: Radiation Oncology

## 2016-12-13 ENCOUNTER — Encounter: Payer: Self-pay | Admitting: Radiation Oncology

## 2016-12-13 VITALS — BP 111/57 | HR 70 | Temp 97.7°F | Ht 64.0 in | Wt 123.2 lb

## 2016-12-13 DIAGNOSIS — C541 Malignant neoplasm of endometrium: Secondary | ICD-10-CM | POA: Diagnosis not present

## 2016-12-13 DIAGNOSIS — C7982 Secondary malignant neoplasm of genital organs: Secondary | ICD-10-CM | POA: Diagnosis not present

## 2016-12-13 DIAGNOSIS — Z79899 Other long term (current) drug therapy: Secondary | ICD-10-CM | POA: Insufficient documentation

## 2016-12-13 DIAGNOSIS — R5383 Other fatigue: Secondary | ICD-10-CM | POA: Insufficient documentation

## 2016-12-13 DIAGNOSIS — Z510 Encounter for antineoplastic radiation therapy: Secondary | ICD-10-CM | POA: Diagnosis not present

## 2016-12-13 NOTE — Progress Notes (Signed)
  Radiation Oncology         [phone]) [phone] ________________________________  Name: Erin Warner MRN: [id number]  Date: 12/13/2016  DOB: 12/18/1955  Vaginal Brachytherapy Procedure Note  CC: No PCP Per Patient [name], MD    ICD-9-CM ICD-10-CM   1. 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 Endometrial carcinoma (HCC) 182.0 C54.1     Diagnosis: Recurrent stage IA (pT1a, pN0), grade 2 endometrial adenocarcinoma  Radiation Treatment Dates:  11/05/16 - 12/07/16 Pelvis: 45 Gy in 25 fractions.  Narrative: She returns today for vaginal cylinder fitting. She reports having burning after urination to the skin in her vaginal area. She has been using desitin which helps a little. She reports the skin on her upper legs and groin is red. She denies having diarrhea, but reports she has a bowel movement about 45 minutes after she eats. She took an imodium, but does not want to take any more in fear of becoming constipated. She reports a  small amount of rectal bleeding yesterday which she thinks came from hemorrhoids.  She reports feeling fatigued.  ALLERGIES: has No Known Allergies.  Meds: Current Outpatient Prescriptions  Medication Sig Dispense Refill  . acetaminophen (TYLENOL) 500 MG tablet Take 500 mg by mouth every 6 (six) hours as needed for headache. Reported on 11/18/2015    . Ascorbic Acid (VITAMIN C) 1000 MG tablet Take 1,000 mg by mouth daily.    . Multiple Vitamin (MULTIVITAMIN) tablet Take 1 tablet by mouth daily.     No current facility-administered medications for this encounter.     Physical Findings: The patient is in no acute distress. Patient is alert and oriented.  height is 5\' 4"  (1.626 m) and weight is 123 lb 3.2 oz (55.9 kg). Her oral temperature is 97.7 F (36.5 C). Her blood pressure is 111/57 (abnormal) and her pulse is 70. Her oxygen saturation is 100%.   No palpable cervical, supraclavicular or axillary lymphoadenopathy. The heart has a  regular rate and rhythm. The lungs are clear to auscultation. Abdomen soft and non-tender.  On pelvic examination, erythema in the perineum from her external beam radiation. No moist desquamation.A speculum exam was performed. Vaginal cuff intact, no mucosal lesions. Some residual biopsy changes at the vaginal cuff. On bimanual exam there were no pelvic masses appreciated.    Lab Findings: Lab Results  Component Value Date   WBC 6.7 12/02/2015   HGB 12.5 12/02/2015   HCT 36.2 12/02/2015   MCV 82.5 12/02/2015   PLT 241 12/02/2015    Radiographic Findings: No results found.  Impression: Recurrent stage IA (pT1a, pN0), grade 2 endometrial adenocarcinoma  Patient was fitted for a vaginal cylinder. The patient will be treated with a 3 cm diameter cylinder with a treatment length of 3 cm. This distended the vaginal vault without undue discomfort. The patient tolerated the procedure well.  The patient was successfully fitted for a vaginal cylinder. The patient is appropriate to begin vaginal brachytherapy.   Plan: The patient will proceed with CT simulation and vaginal brachytherapy today. _______________________________   Blair Promise, PhD, MD  This document serves as a record of services personally performed by Gery Pray, MD. It was created on his behalf by Darcus Austin, a trained medical scribe. The creation of this record is based on the scribe's personal observations and the provider's statements to them. This document has been checked and approved by the attending provider.

## 2016-12-13 NOTE — Progress Notes (Signed)
Danny is here for follow up.  She reports having burning after urination to the skin in her vaginal area.  She has been using desitin which helps a little.  She reports the skin on her upper legs and groin is red.  She denies having diarrhea but reports she has a bowel movement about 45 minutes after she eats.  She reports seeing a small amount of bleeding yesterday which she thinks came from hemorrhoids.  She reports feeling fatigued.    BP (!) 111/57 (BP Location: Right Arm, Patient Position: Sitting)   Pulse 70   Temp 97.7 F (36.5 C) (Oral)   Ht 5\' 4"  (1.626 m)   Wt 123 lb 3.2 oz (55.9 kg)   SpO2 100%   BMI 21.15 kg/m    Wt Readings from Last 3 Encounters:  12/13/16 123 lb 3.2 oz (55.9 kg)  12/04/16 123 lb (55.8 kg)  11/27/16 124 lb 3.2 oz (56.3 kg)

## 2016-12-13 NOTE — Progress Notes (Signed)
  Radiation Oncology         [phone]) [phone] ________________________________  Name: Erin Warner MRN: [id number]   Date: 12/13/2016  DOB: August 04, 1956  SIMULATION AND TREATMENT PLANNING NOTE HDR BRACHYTHERAPY  DIAGNOSIS:  Recurrent stage IA (pT1a, pN0), grade 2 endometrial adenocarcinoma  NARRATIVE:  The patient was brought to the [place] suite.  Identity was confirmed.  All relevant records and images related to the planned course of therapy were reviewed.  The patient freely provided informed written consent to proceed with treatment after reviewing the details related to the planned course of therapy. The consent form was witnessed and verified by the simulation staff.  Then, the patient was set-up in a stable reproducible  supine position for radiation therapy.  CT images were obtained.  Surface markings were placed.  The CT images were loaded into the planning software.  Then the target and avoidance structures were contoured.  Treatment planning then occurred.  The radiation prescription was entered and confirmed.   I have requested : Brachytherapy Isodose Plan and Dosimetry Calculations to plan the radiation distribution.    PLAN:  The patient will receive 24 Gy in 4 fractions. The patient will be treated with a 3 cm diameter cylinder with a treatment length of 3 cm. Iridium-192 will be the high dose rate source. 6 Gy to the mucosal surface. ________________________________  Blair Promise, PhD, MD  This document serves as a record of services personally performed by Gery Pray, MD. It was created on his behalf by Darcus Austin, a trained medical scribe. The creation of this record is based on the scribe's personal observations and the provider's statements to them. This document has been checked and approved by the attending provider.

## 2016-12-13 NOTE — Progress Notes (Signed)
  Radiation Oncology         [phone]) [phone] ________________________________  Name: Erin Warner MRN: [id number]  Date: 12/13/2016  DOB: 1956/06/25  CC: No PCP Per Patient  [name], MD  HDR BRACHYTHERAPY NOTE  DIAGNOSIS: Recurrent stage IA (pT1a, pN0), grade 2 endometrial adenocarcinoma   Simple treatment device note: Patient had construction of her custom vaginal cylinder. She will be treated with a 3 cm diameter segmented cylinder. This conforms to her anatomy without undue discomfort.  Vaginal brachytherapy procedure node: The patient was brought to the [place] suite. Identity was confirmed. All relevant records and images related to the planned course of therapy were reviewed. The patient freely provided informed written consent to proceed with treatment after reviewing the details related to the planned course of therapy. The consent form was witnessed and verified by the simulation staff. Then, the patient was set-up in a stable reproducible supine position for radiation therapy. Pelvic exam revealed the vaginal cuff to be intact. The patient's custom vaginal cylinder was placed in the proximal vagina. This was affixed to the CT/MR stabilization plate to prevent slippage. Patient tolerated the placement well.  Verification simulation note:  A fiducial marker was placed within the vaginal cylinder. An AP and lateral film was then obtained through the pelvis area. This documented accurate position of the vaginal cylinder for treatment.  HDR BRACHYTHERAPY TREATMENT  The remote afterloading device was affixed to the vaginal cylinder by catheter. Patient then proceeded to undergo her first high-dose-rate treatment directed at the proximal vagina. The patient was prescribed a dose of 6 gray to be delivered to the mucosal surface. Treatment length was 3 cm. Patient was treated with 1 channel using 7 dwell positions. Treatment time was 330.3 seconds. Iridium 192 was the high-dose-rate source  for treatment. The patient tolerated the treatment well. After completion of her therapy, a radiation survey was performed documenting return of the iridium source into the GammaMed safe.   PLAN: The patient will return on 12/20/16 for her second HDR treatment. ________________________________  Blair Promise, PhD, MD   This document serves as a record of services personally performed by Gery Pray, MD. It was created on his behalf by Darcus Austin, a trained medical scribe. The creation of this record is based on the scribe's personal observations and the provider's statements to them. This document has been checked and approved by the attending provider.

## 2016-12-19 ENCOUNTER — Telehealth: Payer: Self-pay | Admitting: *Deleted

## 2016-12-19 NOTE — Telephone Encounter (Signed)
Called patient to remind of HDR Tx. For 12-20-16 @ 3 pm, spoke with patient and she is aware of this tx.

## 2016-12-20 ENCOUNTER — Ambulatory Visit
Admission: RE | Admit: 2016-12-20 | Discharge: 2016-12-20 | Disposition: A | Discharge status: Home / Self Care | Payer: BLUE CROSS/BLUE SHIELD | Source: Ambulatory Visit | Attending: Radiation Oncology | Admitting: Radiation Oncology

## 2016-12-20 DIAGNOSIS — Z510 Encounter for antineoplastic radiation therapy: Secondary | ICD-10-CM | POA: Diagnosis not present

## 2016-12-20 DIAGNOSIS — C7982 Secondary malignant neoplasm of genital organs: Secondary | ICD-10-CM

## 2016-12-20 NOTE — Progress Notes (Signed)
  Radiation Oncology         [phone]) [phone] ________________________________  Name: Erin Warner MRN: [id number]  Date: 12/20/2016  DOB: 10-25-1955  CC: No PCP Per Patient  [name], MD  HDR BRACHYTHERAPY NOTE  DIAGNOSIS: Recurrent stage IA (pT1a, pN0), grade 2 endometrial adenocarcinoma   Simple treatment device note: Patient had construction of her custom vaginal cylinder. She will be treated with a 3 cm diameter segmented cylinder. This conforms to her anatomy without undue discomfort.  Vaginal brachytherapy procedure node: The patient was brought to the [place] suite. Identity was confirmed. All relevant records and images related to the planned course of therapy were reviewed. The patient freely provided informed written consent to proceed with treatment after reviewing the details related to the planned course of therapy. The consent form was witnessed and verified by the simulation staff. Then, the patient was set-up in a stable reproducible supine position for radiation therapy. Pelvic exam revealed the vaginal cuff to be intact. The patient's custom vaginal cylinder was placed in the proximal vagina. This was affixed to the CT/MR stabilization plate to prevent slippage. Patient tolerated the placement well.  Verification simulation note:  A fiducial marker was placed within the vaginal cylinder. An AP and lateral film was then obtained through the pelvis area. This documented accurate position of the vaginal cylinder for treatment.  HDR BRACHYTHERAPY TREATMENT  The remote afterloading device was affixed to the vaginal cylinder by catheter. Patient then proceeded to undergo her second high-dose-rate treatment directed at the proximal vagina. The patient was prescribed a dose of 6 gray to be delivered to the mucosal surface. Treatment length was 3 cm. Patient was treated with 1 channel using 7 dwell positions. Treatment time was 352.8 seconds. Iridium 192 was the high-dose-rate source  for treatment. The patient tolerated the treatment well. After completion of her therapy, a radiation survey was performed documenting return of the iridium source into the GammaMed safe.   PLAN: The patient will return on 12/27/16 for her third HDR treatment.  -----------------------------------  Blair Promise, PhD, MD  This document serves as a record of services personally performed by Gery Pray, MD. It was created on his behalf by Maryla Morrow, a trained medical scribe. The creation of this record is based on the scribe's personal observations and the provider's statements to them. This document has been checked and approved by the attending provider.

## 2016-12-26 ENCOUNTER — Telehealth: Payer: Self-pay | Admitting: *Deleted

## 2016-12-26 NOTE — Telephone Encounter (Signed)
Called patient to remind of HDR Tx. for 12-27-16 @ 2 pm, spoke with patient and she is aware of this tx.

## 2016-12-27 ENCOUNTER — Ambulatory Visit
Admission: RE | Admit: 2016-12-27 | Discharge: 2016-12-27 | Disposition: A | Discharge status: Home / Self Care | Payer: BLUE CROSS/BLUE SHIELD | Source: Ambulatory Visit | Attending: Radiation Oncology | Admitting: Radiation Oncology

## 2016-12-27 DIAGNOSIS — Z510 Encounter for antineoplastic radiation therapy: Secondary | ICD-10-CM | POA: Diagnosis not present

## 2016-12-27 DIAGNOSIS — C7982 Secondary malignant neoplasm of genital organs: Secondary | ICD-10-CM

## 2016-12-27 NOTE — Progress Notes (Signed)
  Radiation Oncology         [phone]) [phone] ________________________________  Name: Erin Warner MRN: [id number]  Date: 12/27/2016  DOB: 1956/04/02  CC: No PCP Per Patient  [name], MD  HDR BRACHYTHERAPY NOTE  DIAGNOSIS: Recurrent stage IA (pT1a, pN0), grade 2 endometrial adenocarcinoma   Simple treatment device note: Patient had construction of her custom vaginal cylinder. She will be treated with a 3 cm diameter segmented cylinder. This conforms to her anatomy without undue discomfort.  Vaginal brachytherapy procedure node: The patient was brought to the [place] suite. Identity was confirmed. All relevant records and images related to the planned course of therapy were reviewed. The patient freely provided informed written consent to proceed with treatment after reviewing the details related to the planned course of therapy. The consent form was witnessed and verified by the simulation staff. Then, the patient was set-up in a stable reproducible supine position for radiation therapy. Pelvic exam revealed the vaginal cuff to be intact. The patient's custom vaginal cylinder was placed in the proximal vagina. This was affixed to the CT/MR stabilization plate to prevent slippage. Patient tolerated the placement well.  Verification simulation note:  A fiducial marker was placed within the vaginal cylinder. An AP and lateral film was then obtained through the pelvis area. This documented accurate position of the vaginal cylinder for treatment.  HDR BRACHYTHERAPY TREATMENT  The remote afterloading device was affixed to the vaginal cylinder by catheter. Patient then proceeded to undergo her third high-dose-rate treatment directed at the proximal vagina. The patient was prescribed a dose of 6 gray to be delivered to the mucosal surface. Treatment length was 3 cm. Patient was treated with 1 channel using 7 dwell positions. Treatment time was 376.6 seconds. Iridium 192 was the high-dose-rate source  for treatment. The patient tolerated the treatment well. After completion of her therapy, a radiation survey was performed documenting return of the iridium source into the GammaMed safe.   PLAN: The patient will return on 01/03/17 for her fourth HDR treatment.  -----------------------------------  Blair Promise, PhD, MD  This document serves as a record of services personally performed by Gery Pray, MD. It was created on his behalf by Linward Natal, a trained medical scribe. The creation of this record is based on the scribe's personal observations and the provider's statements to them. This document has been checked and approved by the attending provider.

## 2017-01-02 ENCOUNTER — Telehealth: Payer: Self-pay | Admitting: *Deleted

## 2017-01-02 NOTE — Telephone Encounter (Signed)
CALLED PATIENT TO REMIND OF HDR [place] 01-03-17 @ 2 PM, LVM FOR A RETURN CALL

## 2017-01-03 ENCOUNTER — Ambulatory Visit
Admission: RE | Admit: 2017-01-03 | Discharge: 2017-01-03 | Disposition: A | Discharge status: Home / Self Care | Payer: BLUE CROSS/BLUE SHIELD | Source: Ambulatory Visit | Attending: Radiation Oncology | Admitting: Radiation Oncology

## 2017-01-03 ENCOUNTER — Encounter: Payer: Self-pay | Admitting: Radiation Oncology

## 2017-01-03 DIAGNOSIS — Z510 Encounter for antineoplastic radiation therapy: Secondary | ICD-10-CM | POA: Diagnosis not present

## 2017-01-03 DIAGNOSIS — C7982 Secondary malignant neoplasm of genital organs: Secondary | ICD-10-CM

## 2017-01-03 NOTE — Progress Notes (Signed)
°  Radiation Oncology         [phone]) [phone] ________________________________  Name: Erin Warner MRN: [id number]  Date: 01/03/2017  DOB: [birth year]  End of Treatment Note  Diagnosis:   Recurrent Stage IA, Grade 2 endometrioid endometrial cancer   Indication for treatment:  Curative       Radiation treatment dates:   IMRT: 11/05/16 - 12/07/16 HDR : 12/13/16, 12/20/16, 12/27/16, 01/03/17  Site/dose:   Pelvis treated to 45 Gy in 25 fractions.           Vaginal Cuff treated to 24 Gy in 4 fractions.  Beams/energy:   IMRT : IMRT  //  6X        HDR : HDR Ir-192 Vaginal  //  Iridium HDR  Narrative: The patient tolerated radiation treatment relatively well. She experienced some nausea and queasiness during treatment, as well as a reduced appetite. She experienced diarrhea once daily throughout treatment, for which she occasionally used Imodium. The patient also reported rectal irritation and pain with bowel movements.     Plan: The patient has completed radiation treatment. The patient was instructed to continue using Imodium as needed. She will also use a sitz bath as needed. The patient will return to radiation oncology clinic for routine followup in one month. I advised them to call or return sooner if they have any questions or concerns related to their recovery or treatment.  -----------------------------------  Blair Promise, PhD, MD  This document serves as a record of services personally performed by Gery Pray, MD. It was created on his behalf by Maryla Morrow, a trained medical scribe. The creation of this record is based on the scribe's personal observations and the provider's statements to them. This document has been checked and approved by the attending provider.

## 2017-01-03 NOTE — Progress Notes (Signed)
  Radiation Oncology         [phone]) [phone] ________________________________  Name: Erin Warner MRN: [id number]  Date: 01/03/2017  DOB: Apr 07, 1956  CC: No PCP Per Patient  [name], MD  HDR BRACHYTHERAPY NOTE  DIAGNOSIS: Recurrent stage IA (pT1a, pN0), grade 2 endometrial adenocarcinoma   Simple treatment device note: Patient had construction of her custom vaginal cylinder. She will be treated with a 3 cm diameter segmented cylinder. This conforms to her anatomy without undue discomfort.  Vaginal brachytherapy procedure node: The patient was brought to the [place] suite. Identity was confirmed. All relevant records and images related to the planned course of therapy were reviewed. The patient freely provided informed written consent to proceed with treatment after reviewing the details related to the planned course of therapy. The consent form was witnessed and verified by the simulation staff. Then, the patient was set-up in a stable reproducible supine position for radiation therapy. Pelvic exam revealed the vaginal cuff to be intact. The patient's custom vaginal cylinder was placed in the proximal vagina. This was affixed to the CT/MR stabilization plate to prevent slippage. Patient tolerated the placement well.  Verification simulation note:  A fiducial marker was placed within the vaginal cylinder. An AP and lateral film was then obtained through the pelvis area. This documented accurate position of the vaginal cylinder for treatment.  HDR BRACHYTHERAPY TREATMENT  The remote afterloading device was affixed to the vaginal cylinder by catheter. Patient then proceeded to undergo her fourth high-dose-rate treatment directed at the proximal vagina. The patient was prescribed a dose of 6 gray to be delivered to the mucosal surface. Treatment length was 3 cm. Patient was treated with 1 channel using 7 dwell positions. Treatment time was 402.2 seconds. Iridium 192 was the high-dose-rate source  for treatment. The patient tolerated the treatment well. After completion of her therapy, a radiation survey was performed documenting return of the iridium source into the GammaMed safe.   PLAN: The patient has completed 4 fractions of HDR brachytherapy. She will return to the clinic for follow up in 1 month. I encouraged the patient to call the clinic with any questions or concerns that may arise in the interim.  -----------------------------------  Blair Promise, PhD, MD  This document serves as a record of services personally performed by Gery Pray, MD. It was created on his behalf by Maryla Morrow, a trained medical scribe. The creation of this record is based on the scribe's personal observations and the provider's statements to them. This document has been checked and approved by the attending provider.

## 2017-01-22 IMAGING — CR DG CHEST 2V
2 series · 2 of 2 positions shown · non-contrast
Comparison: 02/02/2010.

CLINICAL DATA: Endometrial cancer.

EXAM:
CHEST  2 VIEW

[w chest pa]
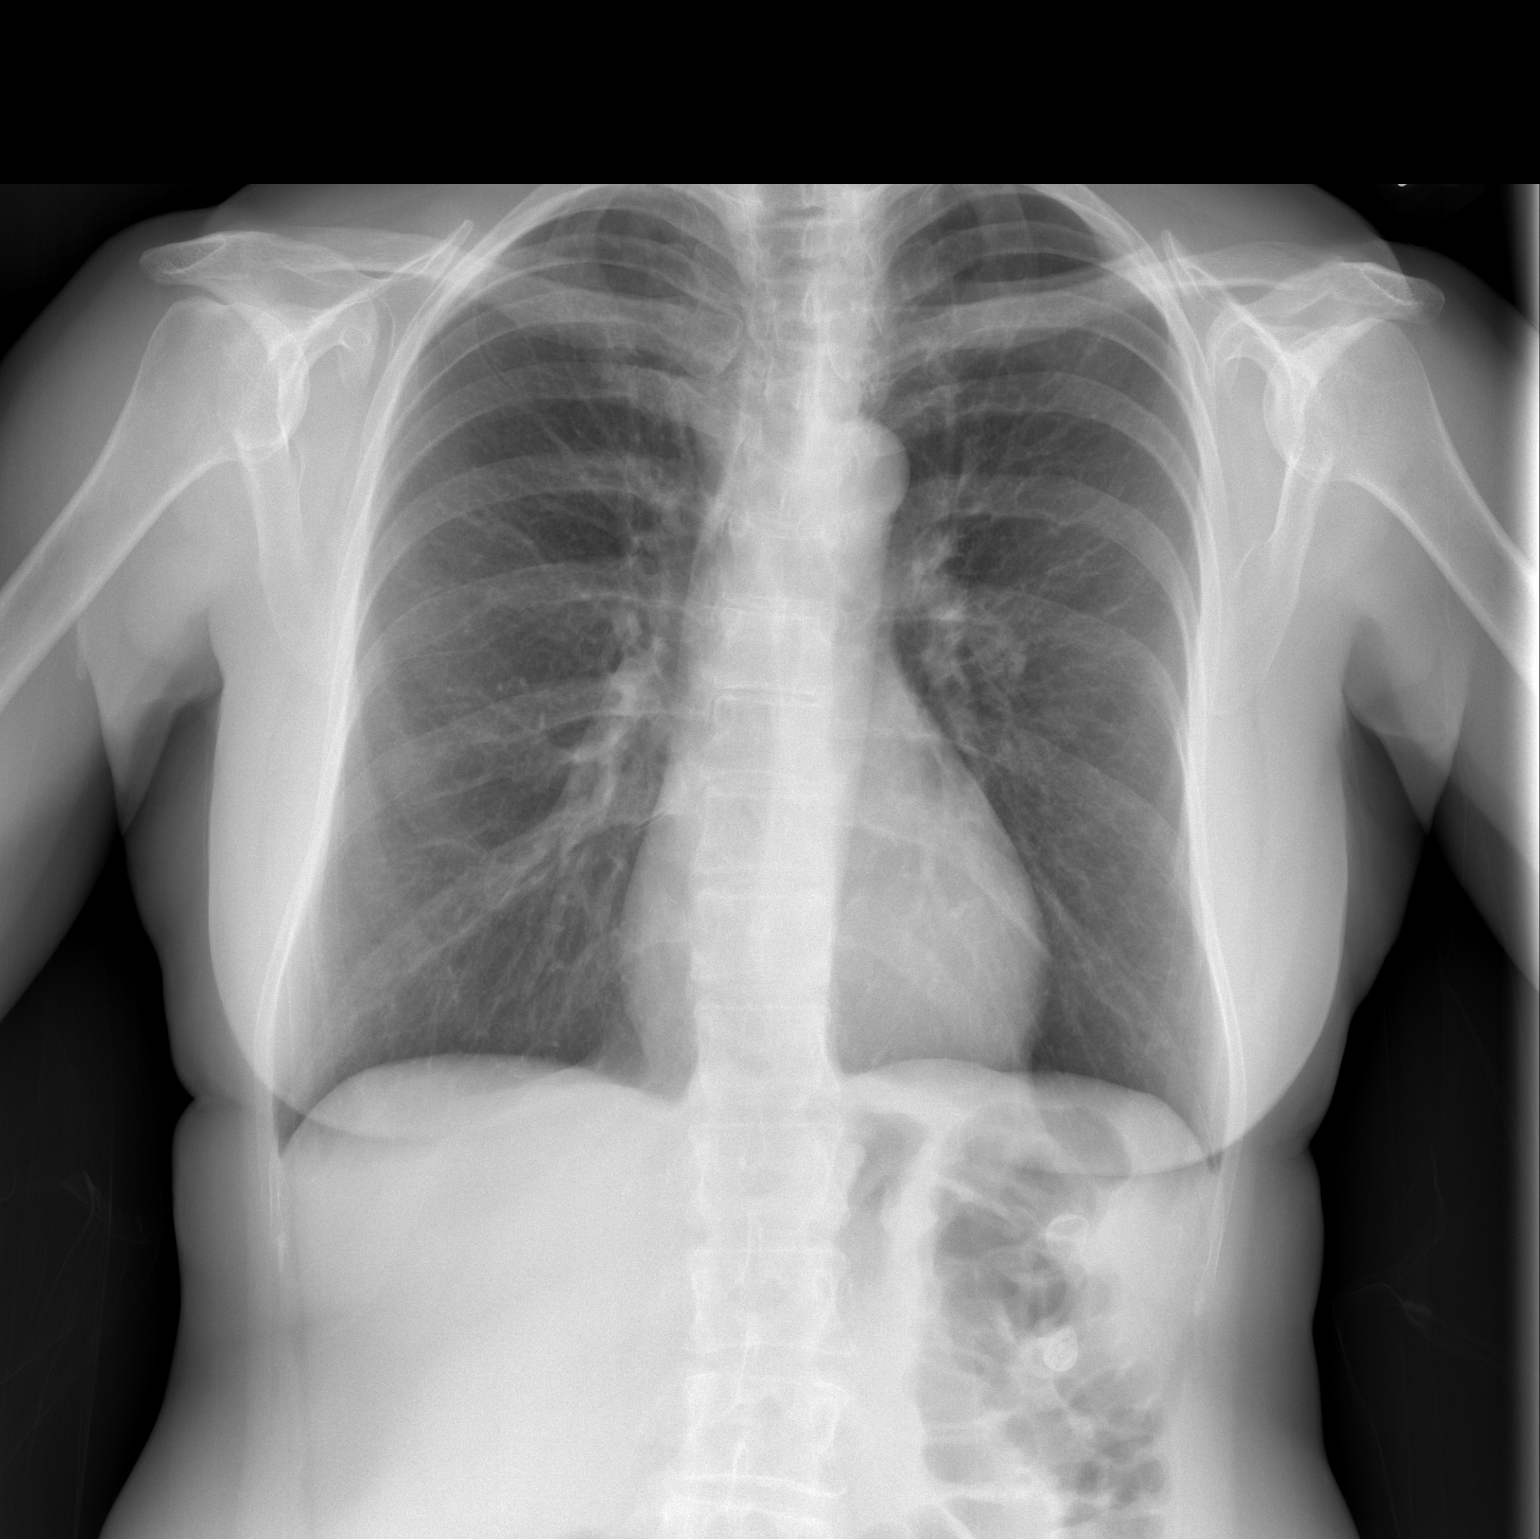

[w chest lat]
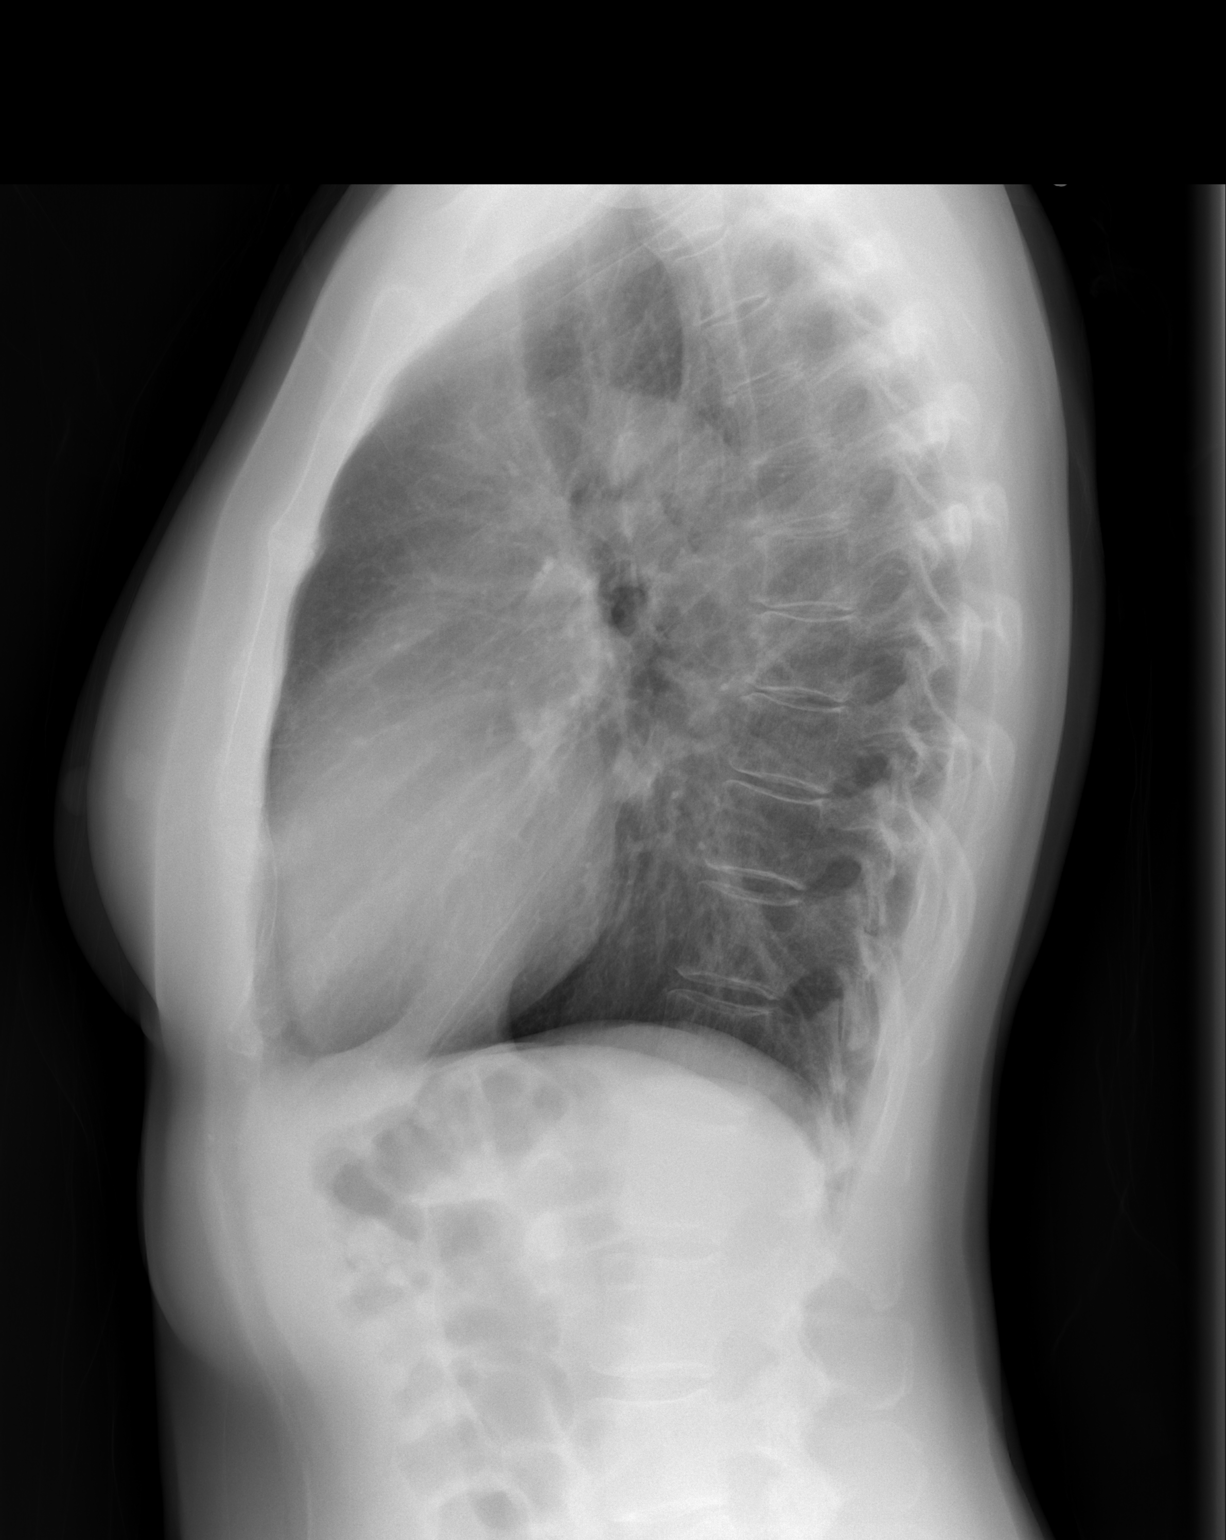

[2 of 2 positions shown; findings below may reference images not displayed]

FINDINGS: The heart size and mediastinal contours are within normal limits.
Both lungs are clear. The visualized skeletal structures are
unremarkable.
IMPRESSION: No active cardiopulmonary disease.

## 2017-01-24 ENCOUNTER — Encounter: Payer: Self-pay | Admitting: Radiation Oncology

## 2017-01-28 ENCOUNTER — Ambulatory Visit
Admission: RE | Admit: 2017-01-28 | Discharge: 2017-01-28 | Disposition: A | Discharge status: Home / Self Care | Payer: BLUE CROSS/BLUE SHIELD | Source: Ambulatory Visit | Attending: Radiation Oncology | Admitting: Radiation Oncology

## 2017-01-28 ENCOUNTER — Telehealth: Payer: Self-pay | Admitting: Oncology

## 2017-01-28 DIAGNOSIS — C7982 Secondary malignant neoplasm of genital organs: Secondary | ICD-10-CM | POA: Insufficient documentation

## 2017-01-28 DIAGNOSIS — C541 Malignant neoplasm of endometrium: Secondary | ICD-10-CM | POA: Insufficient documentation

## 2017-01-28 DIAGNOSIS — Z510 Encounter for antineoplastic radiation therapy: Secondary | ICD-10-CM | POA: Insufficient documentation

## 2017-01-28 HISTORY — DX: Personal history of irradiation: Z92.3

## 2017-01-28 NOTE — Telephone Encounter (Signed)
Left a message for patient regarding her follow up appointment with Dr. Kinard today.  Requested a return call. 

## 2017-01-28 NOTE — Progress Notes (Signed)
  Home Care Instructions for the Insertion and Care of Your Vaginal Dilator  Why Do I Need a Vaginal Dilator?  Internal radiation therapy may cause scar tissue to form at the top of your vagina (vaginal cuff).  This may make vaginal examinations difficult in the future. You can prevent scar tissue from forming by using a vaginal dilator (a smooth plastic rod), and/or by having regular sexual intercourse.  If not using the dilator you should be having intercourse two or three times a week.  If you are unable to have intercourse, you should use your vaginal dilator.  You may have some spotting or bleeding from your dilator or intercourse the first few times. You may also have some discomfort. If discomfort occurs with intercourse, you and your partner may need to stop for a while and try again later.  How to Use Your Vaginal Dilator  - Wash the dilator with soap and water before and after each use. - Check the dilator to be sure it is smooth. Do not use the dilator if you find any roughspots. - Coat the dilator with K-Y Jelly, Astroglide, or Replens. Do not use Vaseline, baby oil, or other oil based lubricants. They are not water-soluble and can be irritating to the tissues in the vagina. - Lie on your back with your knees bent and legs apart. - Insert the rounded end of the dilator into your vagina as far as it will go without causing pain or discomfort. - Close your knees and slowly straighten your legs. - Keep the dilator in your vagina for about 10 to 15 minutes.  Please use 3 times per week (for example: Monday, Wednesday and Friday evenings). [facility] your knees, open your legs, and gently remove the dilator. - Gently cleanse the skin around the vaginal opening. - Wash the dilator after each use. -  It is important that you use the dilator routinely until instructed otherwise by your doctor.

## 2017-02-07 ENCOUNTER — Encounter: Payer: Self-pay | Admitting: Radiation Oncology

## 2017-02-07 ENCOUNTER — Ambulatory Visit
Admission: RE | Admit: 2017-02-07 | Discharge: 2017-02-07 | Disposition: A | Discharge status: Home / Self Care | Payer: BLUE CROSS/BLUE SHIELD | Source: Ambulatory Visit | Attending: Radiation Oncology | Admitting: Radiation Oncology

## 2017-02-07 VITALS — BP 110/72 | HR 63 | Temp 97.9°F | Ht 64.0 in | Wt 122.2 lb

## 2017-02-07 DIAGNOSIS — Z510 Encounter for antineoplastic radiation therapy: Secondary | ICD-10-CM | POA: Diagnosis present

## 2017-02-07 DIAGNOSIS — C541 Malignant neoplasm of endometrium: Secondary | ICD-10-CM | POA: Diagnosis present

## 2017-02-07 DIAGNOSIS — C7982 Secondary malignant neoplasm of genital organs: Secondary | ICD-10-CM

## 2017-02-07 NOTE — Progress Notes (Signed)
Erin Warner is here for follow up.  She denies having any pain.  She reports her bladder is improving and reports her bowel movements are more normal.  She reports having urgency with bowel movements.  She reports having a clear/green vaginal discharge.  She denies having any vaginal bleeding.  She reports having some fatigue.  She has been given size S+ and M vaginal dilators and has been educated on how to use them.  BP 110/72 (BP Location: Left Arm, Patient Position: Sitting)   Pulse 63   Temp 97.9 F (36.6 C) (Oral)   Ht 5\' 4"  (1.626 m)   Wt 122 lb 3.2 oz (55.4 kg)   SpO2 100%   BMI 20.98 kg/m    Wt Readings from Last 3 Encounters:  02/07/17 122 lb 3.2 oz (55.4 kg)  12/13/16 123 lb 3.2 oz (55.9 kg)  12/04/16 123 lb (55.8 kg)

## 2017-02-07 NOTE — Progress Notes (Signed)
  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 The patient denies any pain. She reports improvement in bladder and bowel movements. She reports urgency with bowel movements. She reports a clear/green vaginal discharge. She denies vaginal bleeding. She reports some fatigue.                              ALLERGIES:  has No Known Allergies.  Meds: Current Outpatient Prescriptions  Medication Sig Dispense Refill  . acetaminophen (TYLENOL) 500 MG tablet Take 500 mg by mouth every 6 (six) hours as needed for headache. Reported on 11/18/2015    . Ascorbic Acid (VITAMIN C) 1000 MG tablet Take 1,000 mg by mouth daily.    . Multiple Vitamin (MULTIVITAMIN) tablet Take 1 tablet by mouth daily.     No current facility-administered medications for this encounter.     Physical Findings: The patient is in no acute distress. Patient is alert and oriented.  height is 5\' 4"  (1.626 m) and weight is 122 lb 3.2 oz (55.4 kg). Her oral temperature is 97.9 F (36.6 C). Her blood pressure is 110/72 and her pulse is 63. Her oxygen saturation is 100%. .  No significant changes. Lungs are clear to auscultation bilaterally. Heart has regular rate and rhythm. No palpable cervical, supraclavicular, or axillary adenopathy. Abdomen soft, non-tender, normal bowel sounds. Pelvic exam was deferred in light of recent treatment completion.  Lab Findings: Lab Results    Component Value Date   WBC 6.7 12/02/2015   HGB 12.5 12/02/2015   HCT 36.2 12/02/2015   MCV 82.5 12/02/2015   PLT 241 12/02/2015    Radiographic Findings: No results found.  Impression:  The patient is recovering from the effects of radiation. Patient is still having some persistent fatigue, but otherwise doing well. The patient was given a size S+ and M dilator and instructions on how to use them.  Plan: Patient will follow-up with Dr. Denman George for a pelvic exam in 2 months. She will follow-up in radiation oncology in 5 months.  ____________________________________   This document serves as a record of services personally performed by Gery Pray, MD. It was created on his behalf by Bethann Humble, a trained medical scribe. The creation of this record is based on the scribe's personal observations and the provider's statements to them. This document has been checked and approved by the attending provider.

## 2017-02-14 ENCOUNTER — Encounter: Payer: Self-pay | Admitting: Gastroenterology

## 2017-05-08 ENCOUNTER — Encounter: Payer: Self-pay | Admitting: Gynecologic Oncology

## 2017-05-08 ENCOUNTER — Ambulatory Visit: Payer: BLUE CROSS/BLUE SHIELD | Attending: Gynecologic Oncology | Admitting: Gynecologic Oncology

## 2017-05-08 VITALS — BP 111/58 | HR 74 | Temp 98.1°F | Resp 18 | Wt 124.8 lb

## 2017-05-08 DIAGNOSIS — Z8249 Family history of ischemic heart disease and other diseases of the circulatory system: Secondary | ICD-10-CM | POA: Diagnosis not present

## 2017-05-08 DIAGNOSIS — Z90722 Acquired absence of ovaries, bilateral: Secondary | ICD-10-CM | POA: Insufficient documentation

## 2017-05-08 DIAGNOSIS — C541 Malignant neoplasm of endometrium: Secondary | ICD-10-CM | POA: Diagnosis not present

## 2017-05-08 DIAGNOSIS — C7982 Secondary malignant neoplasm of genital organs: Secondary | ICD-10-CM

## 2017-05-08 DIAGNOSIS — Z90710 Acquired absence of both cervix and uterus: Secondary | ICD-10-CM | POA: Insufficient documentation

## 2017-05-08 DIAGNOSIS — Z8542 Personal history of malignant neoplasm of other parts of uterus: Secondary | ICD-10-CM | POA: Diagnosis not present

## 2017-05-08 DIAGNOSIS — Z923 Personal history of irradiation: Secondary | ICD-10-CM

## 2017-05-08 DIAGNOSIS — Z801 Family history of malignant neoplasm of trachea, bronchus and lung: Secondary | ICD-10-CM | POA: Diagnosis not present

## 2017-05-08 DIAGNOSIS — Z9889 Other specified postprocedural states: Secondary | ICD-10-CM | POA: Insufficient documentation

## 2017-05-08 NOTE — Progress Notes (Signed)
ENDOMETRIAL CANCER FOLLOW-UP  Assessment:    61 y.o. 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   Complete clinical response on exam  Plan: Will see patient back for follow-up at 3 monthly intervals (Dr Sondra Come in October, 2018 and myself in January, 2019).  HPI:  Erin Warner is a 61 y.o. year old [id number] initially seen in consultation on 11/18/15 referred by Dr Hulan Fray for grade 2 endometrial cancer.  She then underwent a robotic assisted total hysterectomy, BSO, SLN biopsy on 05/30/27 without complications.  Her postoperative course was uncomplicated.  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 Given the low risk features on pathology, no adjuvant therapy was recommended in accordance with NCCN guidelines.  Interval Hx: She was seen by Dr Hulan Fray on 09/28/16 for routine follow-up of her low risk endometrial cancer. 2 5-75mm nodules were seen at the right aspect of the vaginal cuff. The areas were entirely resected (grossly) with the biopsy forcep. Final pathology confirme recurrence of endometrioid adenocarcinoma, consistent with recurrent endometrial cancer.  On 116/18 she had restaging CT of chest, abdomen, pelvis. This showed no evidence of metastatic disease in the abdomen or pelvis and no adenopathy. No discrete mass was seen in the vagina. A solitary 49mm LUL pulmonary nodule was identified in the chest, favored benign.  She received salvage radiation with external beam radiation to the pelvis 45 Gy in 25 fractions and 24 Gy to the vaginal cuff in 4 fractions between 11/05/16 - 01/03/17.   She has no symptoms including no bleeding or pain.  Current Outpatient Prescriptions on File Prior to Visit  Medication Sig Dispense Refill  . Ascorbic Acid (VITAMIN C) 1000 MG tablet Take 1,000 mg by  mouth daily.    . Multiple Vitamin (MULTIVITAMIN) tablet Take 1 tablet by mouth daily.    Marland Kitchen acetaminophen (TYLENOL) 500 MG tablet Take 500 mg by mouth every 6 (six) hours as needed for headache. Reported on 11/18/2015     No current facility-administered medications on file prior to visit.    No Known Allergies Past Medical History:  Diagnosis Date  . Cancer ([place])    basal cell, dx.2'17  cervical cancer- surgery planned.  Marland Kitchen History of radiation therapy 11/05/2016 - 12/07/2016   IMRT Pelvis 45 Gy 25 fractions  . History of radiation therapy 12/13/2016, 12/20/2016, 12/27/2016, 01/03/2017   Vaginal Cuff HDR 24 Gy 4 fractions   Past Surgical History:  Procedure Laterality Date  . basal cell removal  2016   back  . BUNIONECTOMY  2009  . ROBOTIC ASSISTED TOTAL HYSTERECTOMY N/A 12/01/2015   Procedure: XI ROBOTIC ASSISTED TOTAL HYSTERECTOMY, BILATERAL SALPINGECTOMY OOPHORECTOMY WITH SENTINAL LYMPH NODE BIOPSY;  Surgeon: [name], MD;  Location: [facility];  Service: Gynecology;  Laterality: N/A;   Family History  Problem Relation Age of Onset  . Cancer Mother 45       Lung  . Heart disease Father    Social History   Social History  . Marital status: Widowed    Spouse name: N/A  . Number of children: 4  . Years of education: N/A   Occupational History  . Not on file.   Social History Main Topics  . Smoking status: Never Smoker  . Smokeless tobacco: Never Used  . Alcohol use No  . Drug use: No  .  Sexual activity: Not Currently    Birth control/ protection: Post-menopausal   Other Topics Concern  . Not on file   Social History Narrative  . No narrative on file    Review of systems: Constitutional:  She has no weight gain or weight loss. She has no fever or chills. Eyes: No blurred vision Ears, Nose, Mouth, Throat: No dizziness, headaches or changes in hearing. No mouth sores. Cardiovascular: No chest pain, palpitations or edema. Respiratory:  No shortness of breath,  wheezing or cough Gastrointestinal: She has normal bowel movements without diarrhea or constipation. She denies any nausea or vomiting. She denies blood in her stool or heart burn. Genitourinary:  She denies pelvic pain, pelvic pressure or changes in her urinary function. She has no hematuria, dysuria, or incontinence. She has no irregular vaginal bleeding or vaginal discharge Musculoskeletal: Denies muscle weakness or joint pains.  Skin:  She has no skin changes, rashes or itching Neurological:  Denies dizziness or headaches. No neuropathy, no numbness or tingling. Psychiatric:  She denies depression or anxiety. Hematologic/Lymphatic:   No easy bruising or bleeding   Physical Exam: Blood pressure (!) 111/58, pulse 74, temperature 98.1 F (36.7 C), temperature source Oral, resp. rate 18, weight 124 lb 12.8 oz (56.6 kg), SpO2 97 %. General: Well dressed, well nourished in no apparent distress.   HEENT:  Normocephalic and atraumatic, no lesions.  Extraocular muscles intact. Sclerae anicteric. Pupils equal, round, reactive. No mouth sores or ulcers. Thyroid is normal size, not nodular, midline. Lungs:  Clear to auscultation bilaterally.  No wheezes. Cardiovascular:  Regular rate and rhythm.  No murmurs or rubs. Abdomen:  Soft, nontender, nondistended.  No palpable masses.  No hepatosplenomegaly.  No ascites. Normal bowel sounds.  No hernias.  Incisions are well healed. Genitourinary: Normal EGBUS  Vaginal cuff intact.  Biopsy site in mid cuff identified but with no visible gross lesions visible or palpable including on rectovaginal exam.  Extremities: No cyanosis, clubbing or edema.  No calf tenderness or erythema. No palpable cords. Psychiatric: Mood and affect are appropriate. Neurological: Awake, alert and oriented x 3. Sensation is intact, no neuropathy.  Musculoskeletal: No pain, normal strength and range of motion.   Donaciano Eva, MD

## 2017-05-08 NOTE — Patient Instructions (Signed)
Please notify Dr Denman George at phone number [phone] if you notice vaginal bleeding, new pelvic or abdominal pains, bloating, feeling full easy, or a change in bladder or bowel function.   Please contact Dr Serita Grit office after your appointment with Dr Sondra Come in October, 2018 to see her in January, 2019.

## 2017-07-11 ENCOUNTER — Ambulatory Visit
Admission: RE | Admit: 2017-07-11 | Discharge: 2017-07-11 | Disposition: A | Discharge status: Home / Self Care | Payer: BLUE CROSS/BLUE SHIELD | Source: Ambulatory Visit | Attending: Radiation Oncology | Admitting: Radiation Oncology

## 2017-07-11 ENCOUNTER — Encounter: Payer: Self-pay | Admitting: Radiation Oncology

## 2017-07-11 VITALS — BP 127/72 | HR 60 | Temp 97.6°F | Ht 64.0 in | Wt 123.0 lb

## 2017-07-11 DIAGNOSIS — C541 Malignant neoplasm of endometrium: Secondary | ICD-10-CM

## 2017-07-11 DIAGNOSIS — Z8542 Personal history of malignant neoplasm of other parts of uterus: Secondary | ICD-10-CM | POA: Diagnosis not present

## 2017-07-11 DIAGNOSIS — Z08 Encounter for follow-up examination after completed treatment for malignant neoplasm: Secondary | ICD-10-CM | POA: Insufficient documentation

## 2017-07-11 DIAGNOSIS — N898 Other specified noninflammatory disorders of vagina: Secondary | ICD-10-CM | POA: Diagnosis not present

## 2017-07-11 DIAGNOSIS — Z923 Personal history of irradiation: Secondary | ICD-10-CM | POA: Diagnosis not present

## 2017-07-11 DIAGNOSIS — C7982 Secondary malignant neoplasm of genital organs: Secondary | ICD-10-CM

## 2017-07-11 LAB — COMPREHENSIVE METABOLIC PANEL
ALT: 11 U/L (ref 0–55)
AST: 17 U/L (ref 5–34)
Albumin: 3.9 g/dL (ref 3.5–5.0)
Alkaline Phosphatase: 88 U/L (ref 40–150)
Anion Gap: 9 mEq/L (ref 3–11)
BUN: 21 mg/dL (ref 7.0–26.0)
CHLORIDE: 110 meq/L — AB (ref 98–109)
CO2: 25 meq/L (ref 22–29)
CREATININE: 0.9 mg/dL (ref 0.6–1.1)
Calcium: 9.1 mg/dL (ref 8.4–10.4)
EGFR: >60 mL/min/{1.73_m2} (ref >60)
GLUCOSE: 90 mg/dL (ref 70–140)
Potassium: 4.1 mEq/L (ref 3.5–5.1)
Sodium: 143 mEq/L (ref 136–145)
Total Bilirubin: 0.61 mg/dL (ref 0.20–1.20)
Total Protein: 6.6 g/dL (ref 6.4–8.3)

## 2017-07-11 LAB — CBC WITH DIFFERENTIAL/PLATELET
BASO%: 0.9 % (ref 0.0–2.0)
BASOS ABS: 0 10*3/uL (ref 0.0–0.1)
EOS ABS: 0.1 10*3/uL (ref 0.0–0.5)
EOS%: 3.9 % (ref 0.0–7.0)
HCT: 42.2 % (ref 34.8–46.6)
HEMOGLOBIN: 14.1 g/dL (ref 11.6–15.9)
LYMPH%: 13.9 % — AB (ref 14.0–49.7)
MCH: 29.3 pg (ref 25.1–34.0)
MCHC: 33.4 g/dL (ref 31.5–36.0)
MCV: 87.6 fL (ref 79.5–101.0)
MONO#: 0.2 10*3/uL (ref 0.1–0.9)
MONO%: 5.7 % (ref 0.0–14.0)
NEUT#: 2.5 10*3/uL (ref 1.5–6.5)
NEUT%: 75.6 % (ref 38.4–76.8)
Platelets: 207 10*3/uL (ref 145–400)
RBC: 4.82 10*6/uL (ref 3.70–5.45)
RDW: 12.8 % (ref 11.2–14.5)
WBC: 3.3 10*3/uL — ABNORMAL LOW (ref 3.9–10.3)
lymph#: 0.5 10*3/uL — ABNORMAL LOW (ref 0.9–3.3)

## 2017-07-11 NOTE — Progress Notes (Signed)
Erin Warner is here for follow up.  She reports having occasional sharp pains in her abdomen.  She denies having any urinary issues.  She reports having more problems with urgency with bowel movements.  She reports having clear watery vaginal discharge that seems to happen more with exercise.  She is wondering if this will go away.  She denies having any vaginal bleeding.  She has not been using a dilator.  She reports that she is very active and said fatigue "hits" her at about 9 pm.  BP 127/72 (BP Location: Right Arm, Patient Position: Sitting)   Pulse 60   Temp 97.6 F (36.4 C) (Oral)   Ht 5\' 4"  (1.626 m)   Wt 123 lb (55.8 kg)   SpO2 100%   BMI 21.11 kg/m    Wt Readings from Last 3 Encounters:  07/11/17 123 lb (55.8 kg)  05/08/17 124 lb 12.8 oz (56.6 kg)  02/07/17 122 lb 3.2 oz (55.4 kg)

## 2017-07-11 NOTE — Progress Notes (Signed)
  Radiation Oncology         [phone]) [phone] ________________________________  Name: Erin Warner MRN: [id number]  Date: 07/11/2017  DOB: 1956/06/18  Follow-Up Visit Note  CC: Patient, No Pcp Per  [name], MD    ICD-10-CM   1. Endometrial carcinoma (HCC) C54.1     Diagnosis: Recurrent Stage IA, Grade 2 endometrioid endometrial cancer   Interval Since Last Radiation:  7 months 12/13/16-01/03/17: 24 Gy to the vaginal cuff in 4 fractions 11/05/16-12/07/16: 45 Gy to the pelvis in 25 fractions  Narrative:  The patient returns today for routine follow-up. She reports occasional sharp pain in her abdomen. She reports increasing urgency with bowel movements. She notes clear/watery vaginal discharge that happens more frequently with exercise. She notes this requires a pad that she changes once a day. She notes fatigue occurs at 9 pm. She remains very active. She denies urinary issues, or vaginal bleeding. Patient was last seen by Dr. Denman George on 05/08/17. A vaginal exam was performed at this time with no evidence of recurrence. Patient denies using her vaginal dilator.               ALLERGIES:  has No Known Allergies.  Meds: Current Outpatient Prescriptions  Medication Sig Dispense Refill  . acetaminophen (TYLENOL) 500 MG tablet Take 500 mg by mouth every 6 (six) hours as needed for headache. Reported on 11/18/2015    . Ascorbic Acid (VITAMIN C) 1000 MG tablet Take 1,000 mg by mouth daily.    . Multiple Vitamin (MULTIVITAMIN) tablet Take 1 tablet by mouth daily.     No current facility-administered medications for this encounter.     Physical Findings: The patient is in no acute distress. Patient is alert and oriented.  height is 5\' 4"  (1.626 m) and weight is 123 lb (55.8 kg). Her oral temperature is 97.6 F (36.4 C). Her blood pressure is 127/72 and her pulse is 60. Her oxygen saturation is 100%. .  No significant changes. Lungs are clear to auscultation bilaterally. Heart has  regular rate and rhythm. No palpable cervical, supraclavicular, or axillary adenopathy. Abdomen soft, non-tender, normal bowel sounds. On pelvic examination the external genitalia were unremarkable. A speculum exam was performed. There are no mucosal lesions noted in the vaginal vault. On bimanual and rectovaginal examination there were no pelvic masses appreciated.  Rectal sphincter tone good   Lab Findings: Lab Results  Component Value Date   WBC 6.7 12/02/2015   HGB 12.5 12/02/2015   HCT 36.2 12/02/2015   MCV 82.5 12/02/2015   PLT 241 12/02/2015    Radiographic Findings: No results found.  Impression:  The patient is recovering from the effects of radiation. No evidence of recurrence on clinical exam. The patient has had some vaginal discharge since brachytherapy we will check to see if the patient is a candidate for esterase cream with Gyn/Onc.  Plan: Patient will follow-up with Dr. Denman George in January 2019. She will follow-up in radiation oncology in 6 months.  We will order a chest CT scan for follow-up on small pulmonary nodule noted on chest CT scan.  ____________________________________   This document serves as a record of services personally performed by Gery Pray, MD. It was created on his behalf by Bethann Humble, a trained medical scribe. The creation of this record is based on the scribe's personal observations and the provider's statements to them. This document has been checked and approved by the attending provider.

## 2017-07-18 ENCOUNTER — Telehealth: Payer: Self-pay

## 2017-07-18 NOTE — Telephone Encounter (Signed)
Told Erin Warner that the vaginal discharge will decrease as she heals from the radiation. She can use Hydrogen Peroxide douches if the discharge develops a foul odor.  It is 1 part hydrogen peroxide to 3 parts water. Pt states no foul odor noted.  She appreciated the information.

## 2017-08-06 ENCOUNTER — Ambulatory Visit (HOSPITAL_COMMUNITY)
Admission: RE | Admit: 2017-08-06 | Discharge: 2017-08-06 | Disposition: A | Discharge status: Home / Self Care | Payer: BLUE CROSS/BLUE SHIELD | Source: Ambulatory Visit | Attending: Radiation Oncology | Admitting: Radiation Oncology

## 2017-08-06 ENCOUNTER — Encounter (HOSPITAL_COMMUNITY): Payer: Self-pay

## 2017-08-06 DIAGNOSIS — C541 Malignant neoplasm of endometrium: Secondary | ICD-10-CM | POA: Diagnosis present

## 2017-08-06 DIAGNOSIS — R911 Solitary pulmonary nodule: Secondary | ICD-10-CM | POA: Diagnosis not present

## 2017-08-06 DIAGNOSIS — K7689 Other specified diseases of liver: Secondary | ICD-10-CM | POA: Insufficient documentation

## 2017-08-06 MED ORDER — IOPAMIDOL (ISOVUE-300) INJECTION 61%
75.0000 mL | Freq: Once | INTRAVENOUS | Status: AC | PRN
  Administered 2017-08-06: 75 mL via INTRAVENOUS

## 2017-08-06 MED ORDER — IOPAMIDOL (ISOVUE-300) INJECTION 61%
INTRAVENOUS | Status: AC
  Administered 2017-08-06: 75 mL via INTRAVENOUS
  Filled 2017-08-06: qty 75

## 2017-08-06 MED ORDER — IOPAMIDOL (ISOVUE-300) INJECTION 61%: 75.0000 mL | Freq: Once | INTRAVENOUS | Status: DC | PRN

## 2017-08-08 ENCOUNTER — Telehealth: Payer: Self-pay | Admitting: Oncology

## 2017-08-08 NOTE — Telephone Encounter (Signed)
Called patient and advised her to the good CT chest results per Dr. Sondra Come.  She is wondering if anything needs to be done with the liver lesions as her husband died from liver cancer.    Marland Kitchenkh

## 2017-08-09 NOTE — Telephone Encounter (Signed)
Called patient back and advised her that the liver lesions will be monitored by CT scans and that Dr. Sondra Come is not concerned at this time.  She verbalized understanding and did not have any further questions.

## 2017-10-07 ENCOUNTER — Ambulatory Visit: Payer: BLUE CROSS/BLUE SHIELD | Admitting: Gynecologic Oncology

## 2017-11-18 ENCOUNTER — Encounter: Payer: Self-pay | Admitting: Gynecologic Oncology

## 2017-11-18 ENCOUNTER — Inpatient Hospital Stay: Payer: BLUE CROSS/BLUE SHIELD | Attending: Gynecologic Oncology | Admitting: Gynecologic Oncology

## 2017-11-18 VITALS — BP 117/62 | HR 68 | Temp 97.9°F | Resp 18 | Ht 64.0 in | Wt 123.7 lb

## 2017-11-18 DIAGNOSIS — Z90710 Acquired absence of both cervix and uterus: Secondary | ICD-10-CM

## 2017-11-18 DIAGNOSIS — C541 Malignant neoplasm of endometrium: Secondary | ICD-10-CM

## 2017-11-18 DIAGNOSIS — Z90722 Acquired absence of ovaries, bilateral: Secondary | ICD-10-CM | POA: Diagnosis not present

## 2017-11-18 DIAGNOSIS — Z923 Personal history of irradiation: Secondary | ICD-10-CM | POA: Diagnosis not present

## 2017-11-18 NOTE — Progress Notes (Signed)
ENDOMETRIAL CANCER FOLLOW-UP  Assessment:    62 y.o. 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   Complete clinical response on exam  Plan: Will see patient back for follow-up at 3 monthly intervals (Dr Sondra Come in April, 2019 and myself in July, 2019).  HPI:  Erin Warner is a 62 y.o. year old [id number] initially seen in consultation on 11/18/15 referred by Dr Hulan Fray for grade 2 endometrial cancer.  She then underwent a robotic assisted total hysterectomy, BSO, SLN biopsy on 03/31/28 without complications.  Her postoperative course was uncomplicated.  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 Given the low risk features on pathology, no adjuvant therapy was recommended in accordance with NCCN guidelines.  Interval Hx: She was seen by Dr Hulan Fray on 09/28/16 for routine follow-up of her low risk endometrial cancer. 2 5-43mm nodules were seen at the right aspect of the vaginal cuff. The areas were entirely resected (grossly) with the biopsy forcep. Final pathology confirme recurrence of endometrioid adenocarcinoma, consistent with recurrent endometrial cancer.  On 116/18 she had restaging CT of chest, abdomen, pelvis. This showed no evidence of metastatic disease in the abdomen or pelvis and no adenopathy. No discrete mass was seen in the vagina. A solitary 46mm LUL pulmonary nodule was identified in the chest, favored benign.  She received salvage radiation with external beam radiation to the pelvis 45 Gy in 25 fractions and 24 Gy to the vaginal cuff in 4 fractions between 11/05/16 - 01/03/17.   She has no symptoms including no bleeding or pain.  Current Outpatient Medications on File Prior to Visit  Medication Sig Dispense Refill  . acetaminophen (TYLENOL) 500 MG tablet Take 500 mg by mouth every 6  (six) hours as needed for headache. Reported on 11/18/2015    . Ascorbic Acid (VITAMIN C) 1000 MG tablet Take 1,000 mg by mouth daily.    . Multiple Vitamin (MULTIVITAMIN) tablet Take 1 tablet by mouth daily.     No current facility-administered medications on file prior to visit.    No Known Allergies Past Medical History:  Diagnosis Date  . Cancer ([place])    basal cell, dx.2'17  cervical cancer- surgery planned.  Marland Kitchen History of radiation therapy 11/05/2016 - 12/07/2016   IMRT Pelvis 45 Gy 25 fractions  . History of radiation therapy 12/13/2016, 12/20/2016, 12/27/2016, 01/03/2017   Vaginal Cuff HDR 24 Gy 4 fractions   Past Surgical History:  Procedure Laterality Date  . basal cell removal  2016   back  . BUNIONECTOMY  2009  . ROBOTIC ASSISTED TOTAL HYSTERECTOMY N/A 12/01/2015   Procedure: XI ROBOTIC ASSISTED TOTAL HYSTERECTOMY, BILATERAL SALPINGECTOMY OOPHORECTOMY WITH SENTINAL LYMPH NODE BIOPSY;  Surgeon: [name], MD;  Location: [facility];  Service: Gynecology;  Laterality: N/A;   Family History  Problem Relation Age of Onset  . Cancer Mother 50       Lung  . Heart disease Father    Social History   Socioeconomic History  . Marital status: Widowed    Spouse name: Not on file  . Number of children: 4  . Years of education: Not on file  . Highest education level: Not on file  Social Needs  . Financial resource strain: Not on file  . Food insecurity - worry: Not on file  . Food insecurity -  inability: Not on file  . Transportation needs - medical: Not on file  . Transportation needs - non-medical: Not on file  Occupational History  . Not on file  Tobacco Use  . Smoking status: Never Smoker  . Smokeless tobacco: Never Used  Substance and Sexual Activity  . Alcohol use: No  . Drug use: No  . Sexual activity: Not Currently    Birth control/protection: Post-menopausal  Other Topics Concern  . Not on file  Social History Narrative  . Not on file    Review of  systems: Constitutional:  She has no weight gain or weight loss. She has no fever or chills. Eyes: No blurred vision Ears, Nose, Mouth, Throat: No dizziness, headaches or changes in hearing. No mouth sores. Cardiovascular: No chest pain, palpitations or edema. Respiratory:  No shortness of breath, wheezing or cough Gastrointestinal: She has normal bowel movements without diarrhea or constipation. She denies any nausea or vomiting. She denies blood in her stool or heart burn. Genitourinary:  She denies pelvic pain, pelvic pressure or changes in her urinary function. She has no hematuria, dysuria, or incontinence. She has no irregular vaginal bleeding or vaginal discharge Musculoskeletal: Denies muscle weakness or joint pains.  Skin:  She has no skin changes, rashes or itching Neurological:  Denies dizziness or headaches. No neuropathy, no numbness or tingling. Psychiatric:  She denies depression or anxiety. Hematologic/Lymphatic:   No easy bruising or bleeding   Physical Exam: Blood pressure 117/62, pulse 68, temperature 97.9 F (36.6 C), temperature source Oral, resp. rate 18, height 5\' 4"  (1.626 m), weight 123 lb 11.2 oz (56.1 kg), SpO2 100 %. General: Well dressed, well nourished in no apparent distress.   HEENT:  Normocephalic and atraumatic, no lesions.  Extraocular muscles intact. Sclerae anicteric. Pupils equal, round, reactive. No mouth sores or ulcers. Thyroid is normal size, not nodular, midline. Lungs:  Clear to auscultation bilaterally.  No wheezes. Cardiovascular:  Regular rate and rhythm.  No murmurs or rubs. Abdomen:  Soft, nontender, nondistended.  No palpable masses.  No hepatosplenomegaly.  No ascites. Normal bowel sounds.  No hernias.  Incisions are well healed. Genitourinary: Normal EGBUS  Upper vagina agglutinated. No lesions or bleeding. Rectal exam confirms no lesions or masses.  Extremities: No cyanosis, clubbing or edema.  No calf tenderness or erythema. No palpable  cords. Psychiatric: Mood and affect are appropriate. Neurological: Awake, alert and oriented x 3. Sensation is intact, no neuropathy.  Musculoskeletal: No pain, normal strength and range of motion.   Donaciano Eva, MD

## 2017-11-18 NOTE — Patient Instructions (Signed)
Please notify Dr Denman George at phone number [phone] if you notice vaginal bleeding, new pelvic or abdominal pains, bloating, feeling full easy, or a change in bladder or bowel function.   Please return to see Dr Sondra Come as scheduled on April 18th. Please ask his office to assist in scheduling follow-up with Dr Denman George in July, 2019.

## 2017-12-04 IMAGING — CT CT ABD-PELV W/ CM
2 of 5 series · 14 of 46 positions shown, 16 images · IV contrast (ISOVUE)
Comparison: 11/10/2015 CT abdomen/ pelvis. 11/28/2015 chest
radiograph.

CLINICAL DATA: 60-year-old female with endometrial cancer diagnosed
October 2015 status post TAHBSO 12/01/2015, with biopsy-proven cuff
recurrence, presenting for restaging.

EXAM:
CT CHEST, ABDOMEN, AND PELVIS WITH CONTRAST
TECHNIQUE: Multidetector CT imaging of the chest, abdomen and pelvis was
performed following the standard protocol during bolus
administration of intravenous contrast.
CONTRAST:  100 cc [id number]-[id number] IOPAMIDOL (ZXGZ8A-6ZZ) INJECTION 61%

[Series 2: c/a/p with · axial · 0.74mm/px · z∈[+1229,+1724]mm · 11 of 119 slices shown, 13 images]
[im 10/119  soft-tissue]
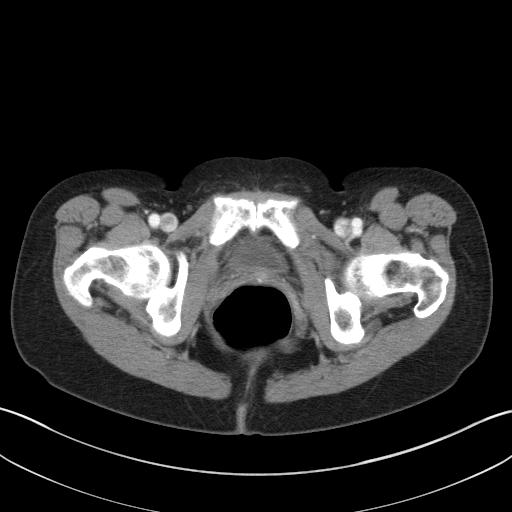
[im 10/119  bone]
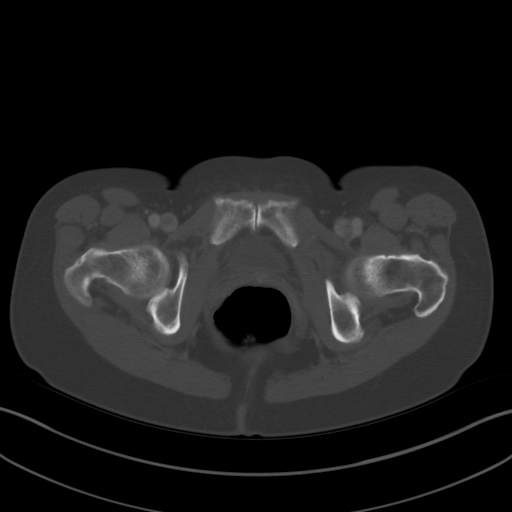
[im 20/119  soft-tissue]
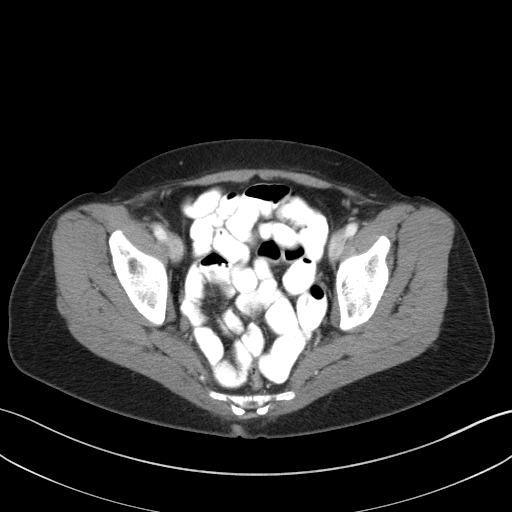
[im 30/119  soft-tissue]
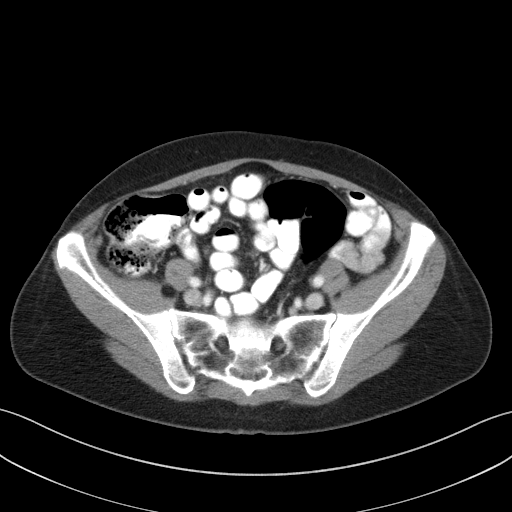
[im 40/119  soft-tissue]
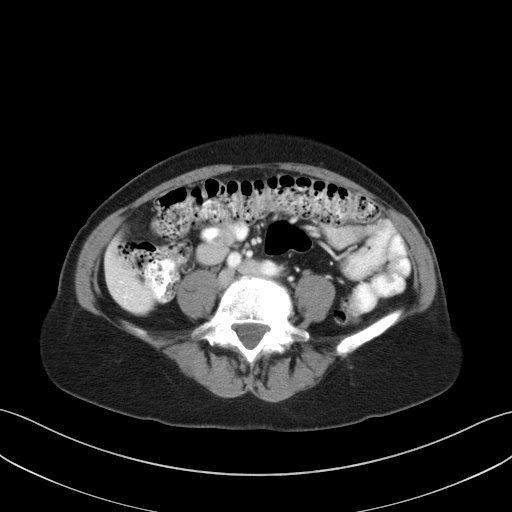
[im 50/119  soft-tissue]
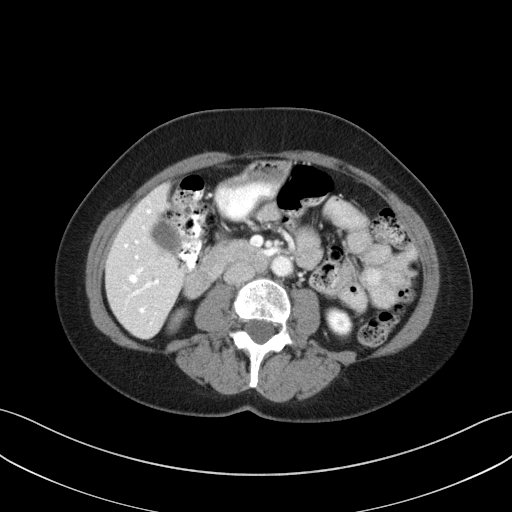
[im 60/119  soft-tissue]
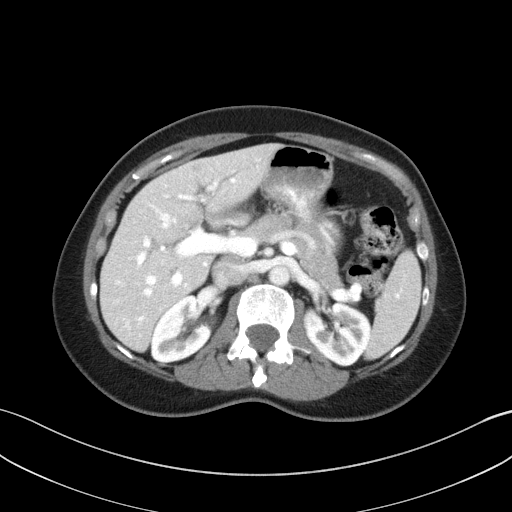
[im 69/119  soft-tissue]
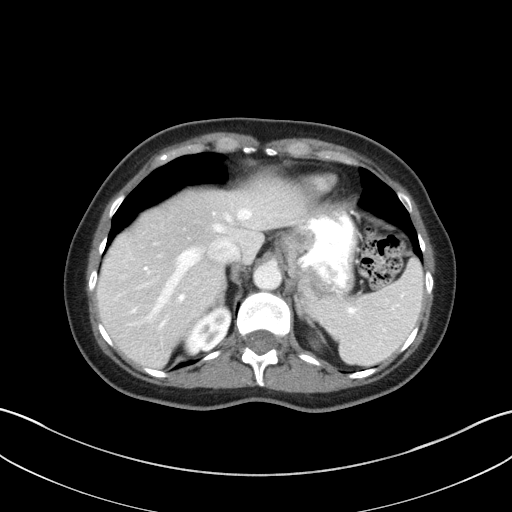
[im 79/119  soft-tissue]
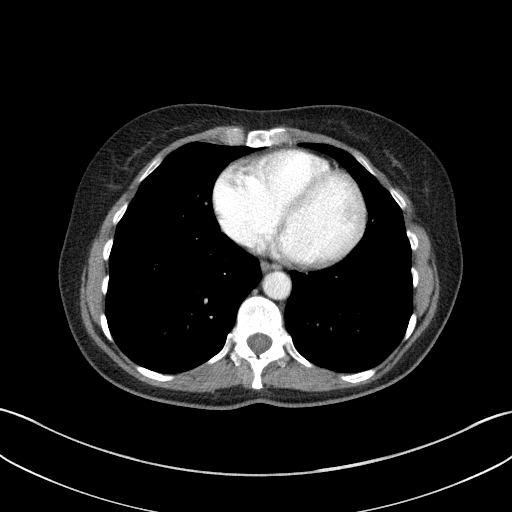
[im 89/119  soft-tissue]
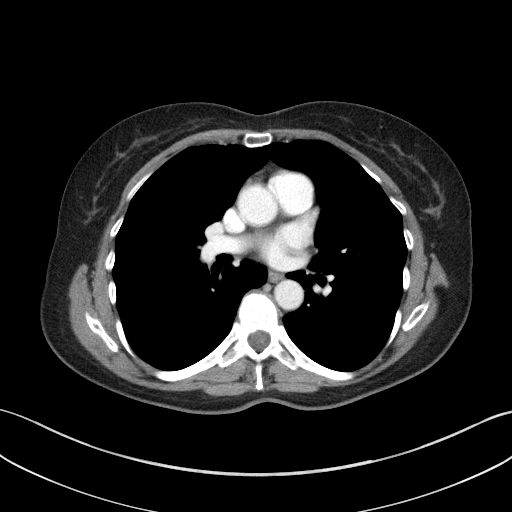
[im 89/119  bone]
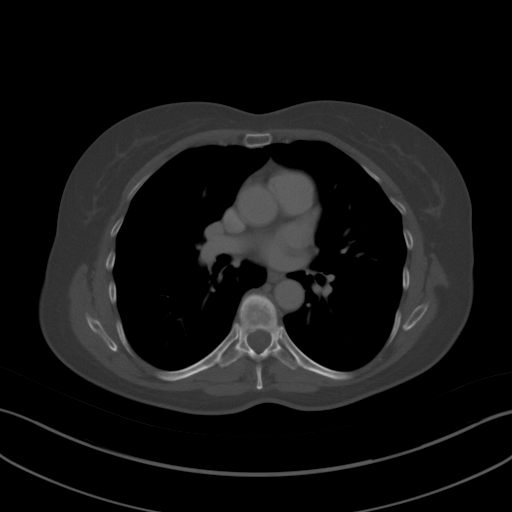
[im 99/119  soft-tissue]
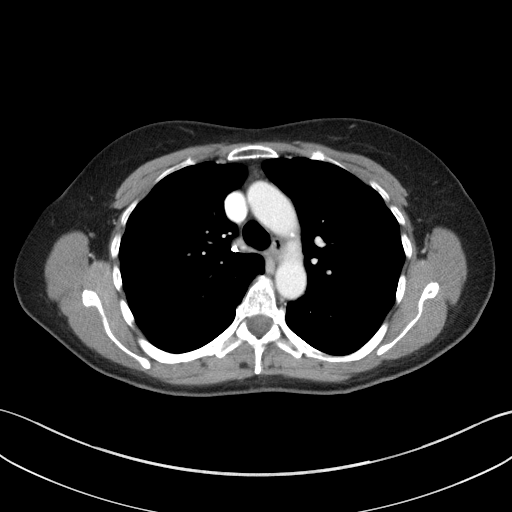
[im 109/119  soft-tissue]
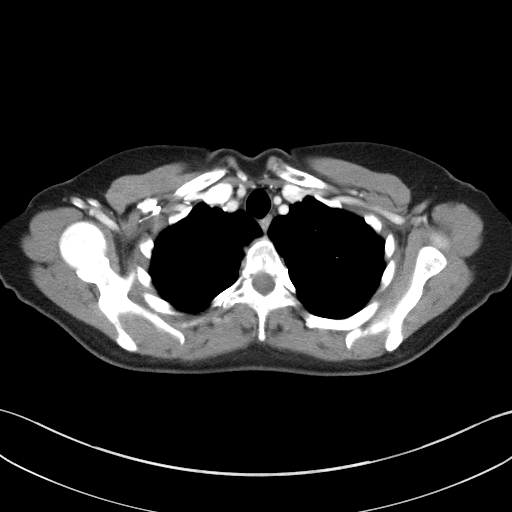

[Series 4: coronal · coronal · 0.74mm/px · 3 of 131 slices shown]
[im 44/131  soft-tissue]
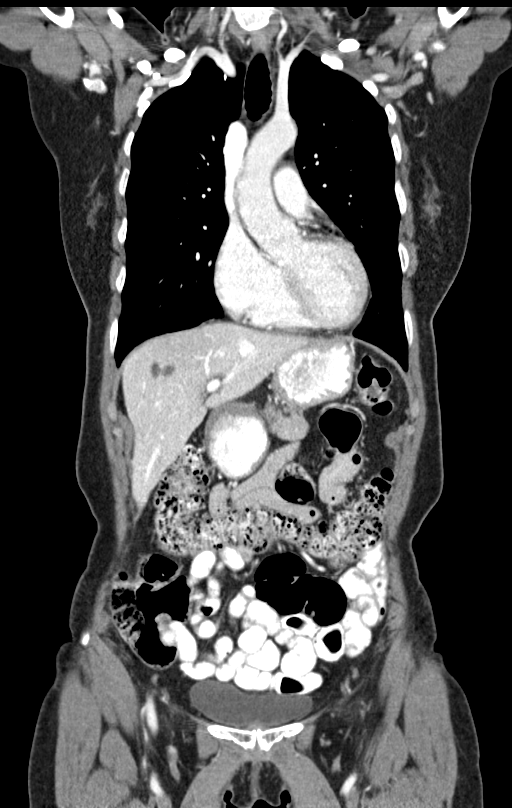
[im 58/131  soft-tissue]
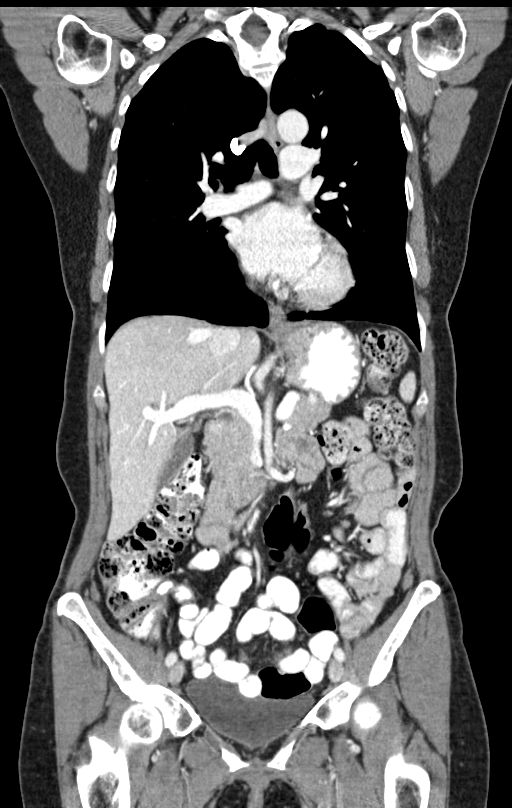
[im 73/131  soft-tissue]
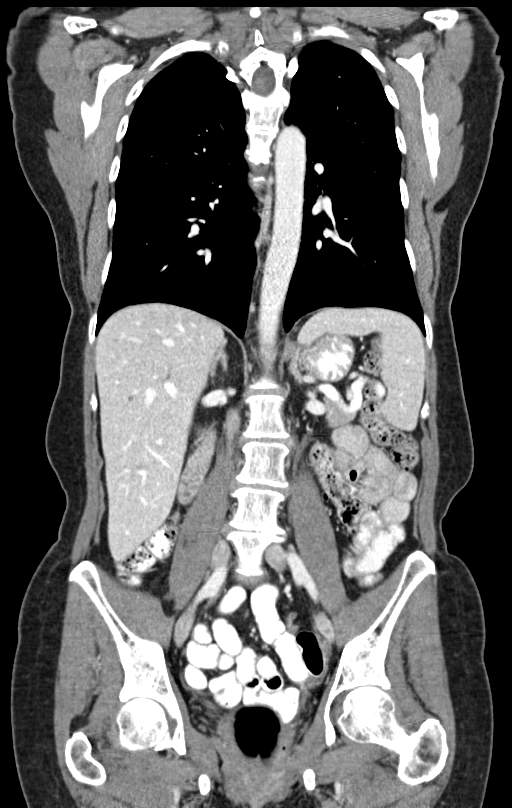

[14 of 46 positions shown; findings below may reference images not displayed]

FINDINGS: CT CHEST FINDINGS

Cardiovascular: Normal heart size. No significant pericardial
fluid/thickening. Great vessels are normal in course and caliber. No
central pulmonary emboli.

Mediastinum/Nodes: No discrete thyroid nodules. Unremarkable
esophagus. No pathologically enlarged axillary, mediastinal or hilar
lymph nodes.

Lungs/Pleura: No pneumothorax. No pleural effusion. There is a solid
4 mm left upper lobe pulmonary nodule (series 7/ image 31). No acute
consolidative airspace disease, lung masses or additional
significant pulmonary nodules. The previously described 8 mm
peripheral left lower lobe nodular opacity on the 11/10/2015 CT
abdomen/pelvis study has resolved.

Musculoskeletal: No aggressive appearing focal osseous lesions. Mild
thoracic spondylosis.

CT ABDOMEN PELVIS FINDINGS

Hepatobiliary: Normal liver size. There are 2 simple liver cysts,
largest 1.4 cm in the medial segment left liver lobe. There are
several additional subcentimeter hypodense liver lesions, which are
too small to characterize and unchanged since 11/10/2015, suggesting
benign lesions. No new liver lesions. Normal gallbladder with no
radiopaque cholelithiasis. No biliary ductal dilatation.

Pancreas: Normal, with no mass or duct dilation.

Spleen: Normal size. No mass.

Adrenals/Urinary Tract: Normal adrenals. Normal kidneys with no
hydronephrosis and no renal mass. Normal bladder.

Stomach/Bowel: Grossly normal stomach. Normal caliber small bowel
with no small bowel wall thickening. Normal appendix . Normal large
bowel with no diverticulosis, large bowel wall thickening or
pericolonic fat stranding.

Vascular/Lymphatic: Normal caliber abdominal aorta. Patent portal,
splenic, hepatic and renal veins. No pathologically enlarged lymph
nodes in the abdomen or pelvis.

Reproductive: Status post hysterectomy, with no evidence of a mass
or fluid collection at the vaginal cuff. No adnexal mass.

Other: No pneumoperitoneum, ascites or focal fluid collection.

Musculoskeletal: No aggressive appearing focal osseous lesions.
Moderate lumbar spondylosis, most prominent at L4-5.
IMPRESSION: 1. No discrete vaginal cuff mass or other findings of local tumor
recurrence in the pelvis.
2. No evidence of metastatic disease in the abdomen or pelvis. No
abdominopelvic adenopathy.
3. Solitary 4 mm left upper lobe pulmonary nodule, indeterminate for
metastasis, although more likely benign given upper lobe location.
Recommend attention on a follow-up chest CT in 3 months.

## 2018-01-09 ENCOUNTER — Encounter: Payer: Self-pay | Admitting: Radiation Oncology

## 2018-01-09 ENCOUNTER — Ambulatory Visit
Admission: RE | Admit: 2018-01-09 | Discharge: 2018-01-09 | Disposition: A | Discharge status: Home / Self Care | Payer: BLUE CROSS/BLUE SHIELD | Source: Ambulatory Visit | Attending: Radiation Oncology | Admitting: Radiation Oncology

## 2018-01-09 ENCOUNTER — Telehealth: Payer: Self-pay | Admitting: *Deleted

## 2018-01-09 ENCOUNTER — Other Ambulatory Visit: Payer: Self-pay

## 2018-01-09 VITALS — BP 111/69 | HR 66 | Temp 97.5°F | Resp 20 | Wt 125.1 lb

## 2018-01-09 DIAGNOSIS — R5383 Other fatigue: Secondary | ICD-10-CM | POA: Insufficient documentation

## 2018-01-09 DIAGNOSIS — N898 Other specified noninflammatory disorders of vagina: Secondary | ICD-10-CM | POA: Diagnosis not present

## 2018-01-09 DIAGNOSIS — C541 Malignant neoplasm of endometrium: Secondary | ICD-10-CM | POA: Diagnosis present

## 2018-01-09 DIAGNOSIS — C7982 Secondary malignant neoplasm of genital organs: Secondary | ICD-10-CM

## 2018-01-09 NOTE — Progress Notes (Signed)
Radiation Oncology         [phone] ________________________________  Name: Erin Warner MRN: [id number]  Date: 01/09/2018  DOB: 12/02/1955  Follow-Up Visit Note  CC: Patient, No Pcp Per  Rossi, Emma, MD    ICD-10-CM   1. Endometrial cancer (HCC) C54.1     Diagnosis: Recurrent Stage IA, Grade 2 endometrioid endometrial cancer   Interval Since Last Radiation:  1 year 1 month 12/13/16-01/03/17: 24 Gy to the vaginal cuff in 4 fractions 11/05/16-12/07/16: 45 Gy to the pelvis in 25 fractions  Narrative:  The patient returns today for routine follow-up. She is doing well overall. 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   She also met with Dr. Rossi with her last visit being on 11/18/2017.   Since her last visit to the office, she underwent a CT chest with contrast on 08/07/2017 with results of: Stable exam.  No new or progressive findings. 4 mm posterior left upper lobe pulmonary nodule, reassuringly stable in the 10 month interval since prior study. Continued attention on follow-up recommended. Multiple hepatic cysts are unchanged and multiple hypoattenuating liver lesions, too small to characterize, are similar to prior.   On review of systems, she reports improved bowel issues, mild fatigue, vaginal discharge (clear), intermittent abdominal bloating. She denies pelvic pain, vaginal bleeding, rectal bleeding, nausea, vomiting, and any other symptoms.                  ALLERGIES:  has No Known Allergies.  Meds: Current Outpatient Medications  Medication Sig Dispense Refill  . Multiple Vitamin (MULTIVITAMIN) tablet Take 1 tablet by mouth daily.    . acetaminophen (TYLENOL) 500 MG tablet Take 500 mg by mouth every 6 (six) hours as needed for  headache. Reported on 11/18/2015    . Ascorbic Acid (VITAMIN C) 1000 MG tablet Take 1,000 mg by mouth daily.     No current facility-administered medications for this encounter.     Physical Findings: The patient is in no acute distress. Patient is alert and oriented.  weight is 125 lb 2 oz (56.8 kg). Her oral temperature is 97.5 F (36.4 C) (abnormal). Her blood pressure is 111/69 and her pulse is 66. Her respiration is 20 and oxygen saturation is 99%. .  No significant changes. Lungs are clear to auscultation bilaterally. Heart has regular rate and rhythm. No palpable cervical, supraclavicular, or axillary adenopathy. Abdomen soft, non-tender, normal bowel sounds. On pelvic examination the external genitalia were unremarkable. A speculum exam was performed. There are no mucosal lesions noted in the vaginal vault. On bimanual and rectovaginal examination there were no pelvic masses appreciated.  Rectal sphincter tone good   Lab Findings: Lab Results  Component Value Date   WBC 3.3 (L) 07/11/2017   HGB 14.1 07/11/2017   HCT 42.2 07/11/2017   MCV 87.6 07/11/2017   PLT 207 07/11/2017    Radiographic Findings: No results found.  Impression:  No evidence of recurrence on clinical exam.   Plan: Patient will return for follow up with Dr. Rossi in July 2019 and Radiation Oncology in October 2019.      ____________________________________   This document serves as a record of services personally performed by James Kinard, MD. It was created on   his behalf by Steva Colder, a trained medical scribe. The creation of this record is based on the scribe's personal observations and the provider's statements to them. This document has been checked and approved by the attending provider.

## 2018-01-09 NOTE — Telephone Encounter (Signed)
Shirley from radiation called and scheduled a follow up appt for the patient. She will contact the patient

## 2018-01-09 NOTE — Progress Notes (Signed)
Erin Warner is here for her follow-up appointment.Patient denies any pain. States that she has mild fatigue. States that she has a clear discharge if she does a lot of heavy lifting . It does not have a smell. Denies any vaginal or rectal bleeding. Denies any issues with her bowels. Denies any nausea or vomiting. Reports nocturia x1. Denies any dysuria. Denies any skin irritation. Patient states that she uses her dilator 2-3 times per week. Vitals:   01/09/18 0853  BP: 111/69  Pulse: 66  Resp: 20  Temp: (!) 97.5 F (36.4 C)  TempSrc: Oral  SpO2: 99%  Weight: 125 lb 2 oz (56.8 kg)   Wt Readings from Last 3 Encounters:  01/09/18 125 lb 2 oz (56.8 kg)  11/18/17 123 lb 11.2 oz (56.1 kg)  07/11/17 123 lb (55.8 kg)

## 2018-03-14 ENCOUNTER — Telehealth: Payer: Self-pay | Admitting: *Deleted

## 2018-03-14 NOTE — Telephone Encounter (Signed)
Called and left the patient a message to call the office back. Need to move the appt from July 19th to July 23rd

## 2018-03-14 NOTE — Telephone Encounter (Signed)
Returned the patient's call and moved her appt from July 19th to July 23rd

## 2018-04-11 ENCOUNTER — Ambulatory Visit: Payer: BLUE CROSS/BLUE SHIELD | Admitting: Gynecologic Oncology

## 2018-04-15 ENCOUNTER — Encounter: Payer: Self-pay | Admitting: Gynecologic Oncology

## 2018-04-15 ENCOUNTER — Inpatient Hospital Stay: Payer: BLUE CROSS/BLUE SHIELD | Attending: Gynecologic Oncology | Admitting: Gynecologic Oncology

## 2018-04-15 VITALS — BP 114/71 | HR 66 | Temp 98.2°F | Resp 20 | Ht 64.0 in | Wt 126.8 lb

## 2018-04-15 DIAGNOSIS — Z90722 Acquired absence of ovaries, bilateral: Secondary | ICD-10-CM | POA: Diagnosis not present

## 2018-04-15 DIAGNOSIS — Z90710 Acquired absence of both cervix and uterus: Secondary | ICD-10-CM

## 2018-04-15 DIAGNOSIS — Z923 Personal history of irradiation: Secondary | ICD-10-CM | POA: Diagnosis not present

## 2018-04-15 DIAGNOSIS — C541 Malignant neoplasm of endometrium: Secondary | ICD-10-CM

## 2018-04-15 NOTE — Patient Instructions (Signed)
Please notify Dr Denman George at phone number [phone] if you notice vaginal bleeding, new pelvic or abdominal pains, bloating, feeling full easy, or a change in bladder or bowel function.   Please ask Dr Clabe Seal office to schedule an appointment with Dr Denman George for January after you see him in October.

## 2018-04-15 NOTE — Progress Notes (Signed)
ENDOMETRIAL CANCER FOLLOW-UP  Assessment:    62 y.o. 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   Complete clinical response on exam  Plan: Will see patient back for follow-up at 3 monthly intervals (Dr Sondra Come in October, 2019 and myself in January, 2020).  HPI:  Erin Warner is a 62 y.o. year old [id number] initially seen in consultation on 11/18/15 referred by Dr Hulan Fray for grade 2 endometrial cancer.  She then underwent a robotic assisted total hysterectomy, BSO, SLN biopsy on 0/2/72 without complications.  Her postoperative course was uncomplicated.  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 Given the low risk features on pathology, no adjuvant therapy was recommended in accordance with NCCN guidelines.  Interval Hx: She was seen by Dr Hulan Fray on 09/28/16 for routine follow-up of her low risk endometrial cancer. 2 5-61mm nodules were seen at the right aspect of the vaginal cuff. The areas were entirely resected (grossly) with the biopsy forcep. Final pathology confirme recurrence of endometrioid adenocarcinoma, consistent with recurrent endometrial cancer.  On 116/18 she had restaging CT of chest, abdomen, pelvis. This showed no evidence of metastatic disease in the abdomen or pelvis and no adenopathy. No discrete mass was seen in the vagina. A solitary 50mm LUL pulmonary nodule was identified in the chest, favored benign.  She received salvage radiation with external beam radiation to the pelvis 45 Gy in 25 fractions and 24 Gy to the vaginal cuff in 4 fractions between 11/05/16 - 01/03/17.   She has no symptoms including no bleeding or pain.  Current Outpatient Medications on File Prior to Visit  Medication Sig Dispense Refill  . acetaminophen (TYLENOL) 500 MG tablet Take 500 mg by mouth  every 6 (six) hours as needed for headache. Reported on 11/18/2015    . Ascorbic Acid (VITAMIN C) 1000 MG tablet Take 1,000 mg by mouth daily.    . Multiple Vitamin (MULTIVITAMIN) tablet Take 1 tablet by mouth daily.     No current facility-administered medications on file prior to visit.    No Known Allergies Past Medical History:  Diagnosis Date  . Cancer ([place])    basal cell, dx.2'17  cervical cancer- surgery planned.  Marland Kitchen History of radiation therapy 11/05/2016 - 12/07/2016   IMRT Pelvis 45 Gy 25 fractions  . History of radiation therapy 12/13/2016, 12/20/2016, 12/27/2016, 01/03/2017   Vaginal Cuff HDR 24 Gy 4 fractions   Past Surgical History:  Procedure Laterality Date  . basal cell removal  2016   back  . BUNIONECTOMY  2009  . ROBOTIC ASSISTED TOTAL HYSTERECTOMY N/A 12/01/2015   Procedure: XI ROBOTIC ASSISTED TOTAL HYSTERECTOMY, BILATERAL SALPINGECTOMY OOPHORECTOMY WITH SENTINAL LYMPH NODE BIOPSY;  Surgeon: [name], MD;  Location: [facility];  Service: Gynecology;  Laterality: N/A;   Family History  Problem Relation Age of Onset  . Cancer Mother 38       Lung  . Heart disease Father    Social History   Socioeconomic History  . Marital status: Widowed    Spouse name: Not on file  . Number of children: 4  . Years of education: Not on file  . Highest education level: Not on file  Occupational History  . Not on file  Social Needs  . Financial resource strain: Not on file  . Food insecurity:  Worry: Not on file    Inability: Not on file  . Transportation needs:    Medical: Not on file    Non-medical: Not on file  Tobacco Use  . Smoking status: Never Smoker  . Smokeless tobacco: Never Used  Substance and Sexual Activity  . Alcohol use: No  . Drug use: No  . Sexual activity: Not Currently    Birth control/protection: Post-menopausal  Lifestyle  . Physical activity:    Days per week: Not on file    Minutes per session: Not on file  . Stress: Not on file   Relationships  . Social connections:    Talks on phone: Not on file    Gets together: Not on file    Attends religious service: Not on file    Active member of club or organization: Not on file    Attends meetings of clubs or organizations: Not on file    Relationship status: Not on file  . Intimate partner violence:    Fear of current or ex partner: Not on file    Emotionally abused: Not on file    Physically abused: Not on file    Forced sexual activity: Not on file  Other Topics Concern  . Not on file  Social History Narrative  . Not on file    Review of systems: Constitutional:  She has no weight gain or weight loss. She has no fever or chills. Eyes: No blurred vision Ears, Nose, Mouth, Throat: No dizziness, headaches or changes in hearing. No mouth sores. Cardiovascular: No chest pain, palpitations or edema. Respiratory:  No shortness of breath, wheezing or cough Gastrointestinal: She has normal bowel movements without diarrhea or constipation. She denies any nausea or vomiting. She denies blood in her stool or heart burn. Genitourinary:  She denies pelvic pain, pelvic pressure or changes in her urinary function. She has no hematuria, dysuria, or incontinence. She has no irregular vaginal bleeding or vaginal discharge Musculoskeletal: Denies muscle weakness or joint pains.  Skin:  She has no skin changes, rashes or itching Neurological:  Denies dizziness or headaches. No neuropathy, no numbness or tingling. Psychiatric:  She denies depression or anxiety. Hematologic/Lymphatic:   No easy bruising or bleeding   Physical Exam: Blood pressure 114/71, pulse 66, temperature 98.2 F (36.8 C), temperature source Oral, resp. rate 20, height 5\' 4"  (1.626 m), weight 126 lb 12.8 oz (57.5 kg), SpO2 99 %. General: Well dressed, well nourished in no apparent distress.   HEENT:  Normocephalic and atraumatic, no lesions.  Extraocular muscles intact. Sclerae anicteric. Pupils equal, round,  reactive. No mouth sores or ulcers. Thyroid is normal size, not nodular, midline. Lungs:  Clear to auscultation bilaterally.  No wheezes. Cardiovascular:  Regular rate and rhythm.  No murmurs or rubs. Abdomen:  Soft, nontender, nondistended.  No palpable masses.  No hepatosplenomegaly.  No ascites. Normal bowel sounds.  No hernias.  Incisions are well healed. Genitourinary: Normal EGBUS  Upper vagina agglutinated, and vaginal length significantly shortened. No lesions or bleeding. Rectal exam confirms no lesions or masses.  Extremities: No cyanosis, clubbing or edema.  No calf tenderness or erythema. No palpable cords. Psychiatric: Mood and affect are appropriate. Neurological: Awake, alert and oriented x 3. Sensation is intact, no neuropathy.  Musculoskeletal: No pain, normal strength and range of motion.   Thereasa Solo, MD

## 2018-07-17 ENCOUNTER — Inpatient Hospital Stay: Payer: BLUE CROSS/BLUE SHIELD | Attending: Gynecologic Oncology | Admitting: Gynecologic Oncology

## 2018-07-17 ENCOUNTER — Ambulatory Visit
Admission: RE | Admit: 2018-07-17 | Discharge: 2018-07-17 | Disposition: A | Discharge status: Home / Self Care | Payer: BLUE CROSS/BLUE SHIELD | Source: Ambulatory Visit | Attending: Radiation Oncology | Admitting: Radiation Oncology

## 2018-07-17 ENCOUNTER — Encounter: Payer: Self-pay | Admitting: Radiation Oncology

## 2018-07-17 ENCOUNTER — Encounter: Payer: Self-pay | Admitting: Gynecologic Oncology

## 2018-07-17 ENCOUNTER — Other Ambulatory Visit: Payer: Self-pay

## 2018-07-17 VITALS — BP 122/72 | HR 60 | Temp 97.6°F | Resp 16 | Ht 64.0 in | Wt 124.0 lb

## 2018-07-17 VITALS — BP 124/71 | HR 66 | Temp 97.7°F | Resp 18 | Ht 64.0 in | Wt 124.5 lb

## 2018-07-17 DIAGNOSIS — Z90710 Acquired absence of both cervix and uterus: Secondary | ICD-10-CM

## 2018-07-17 DIAGNOSIS — C7982 Secondary malignant neoplasm of genital organs: Secondary | ICD-10-CM

## 2018-07-17 DIAGNOSIS — C541 Malignant neoplasm of endometrium: Secondary | ICD-10-CM | POA: Insufficient documentation

## 2018-07-17 DIAGNOSIS — Z923 Personal history of irradiation: Secondary | ICD-10-CM

## 2018-07-17 DIAGNOSIS — Z90722 Acquired absence of ovaries, bilateral: Secondary | ICD-10-CM

## 2018-07-17 DIAGNOSIS — N369 Urethral disorder, unspecified: Secondary | ICD-10-CM | POA: Diagnosis not present

## 2018-07-17 NOTE — Progress Notes (Signed)
ENDOMETRIAL CANCER FOLLOW-UP  Assessment:    62 y.o. 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 If malignant, would consider referral for surgical resection.  HPI:  Erin Warner is a 62 y.o. year old [id number] initially seen in consultation on 11/18/15 referred by Dr Hulan Fray for grade 2 endometrial cancer.  She then underwent a robotic assisted total hysterectomy, BSO, SLN biopsy on 05/31/01 without complications.  Her postoperative course was uncomplicated.  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 Given the low risk features on pathology, no adjuvant therapy was recommended in accordance with NCCN guidelines.  She was seen by Dr Hulan Fray on 09/28/16 for routine follow-up of her low risk endometrial cancer. 2 5-34mm nodules were seen at the right aspect of the vaginal cuff. The areas were entirely resected (grossly) with the biopsy forcep. Final pathology confirme recurrence of endometrioid adenocarcinoma, consistent with recurrent endometrial cancer.  On 116/18 she had restaging CT of chest, abdomen, pelvis. This showed no evidence of metastatic disease in the abdomen or pelvis and no adenopathy. No discrete mass was seen in the vagina. A solitary 61mm LUL pulmonary nodule was identified in the chest, favored benign.  She received salvage radiation with external beam radiation to the pelvis 45 Gy in 25 fractions and 24 Gy to the vaginal cuff in 4 fractions between 11/05/16 - 01/03/17.   Interval Hx: She has no symptoms of  bleeding, discomfort, difficulty with urination. She was seen today for surveillance by Dr Sondra Come and a urethral lesion was identified on examination.   Current Outpatient Medications on File Prior to Visit  Medication Sig Dispense Refill  . acetaminophen (TYLENOL) 500 MG tablet Take 500 mg by mouth every 6 (six) hours as needed for headache. Reported on 11/18/2015    . Ascorbic Acid (VITAMIN C) 1000 MG tablet Take 1,000 mg by mouth daily.    . Multiple Vitamin (MULTIVITAMIN) tablet Take 1 tablet by mouth daily.     No current facility-administered medications on file prior to visit.    No Known Allergies Past Medical History:  Diagnosis Date  . Cancer ([place])    basal cell, dx.2'17  cervical cancer- surgery planned.  Marland Kitchen History of radiation therapy 11/05/2016 - 12/07/2016   IMRT Pelvis 45 Gy 25 fractions  . History of radiation therapy 12/13/2016, 12/20/2016, 12/27/2016, 01/03/2017   Vaginal Cuff HDR 24 Gy 4 fractions   Past Surgical History:  Procedure Laterality Date  . basal cell removal  2016   back  . BUNIONECTOMY  2009  . ROBOTIC ASSISTED TOTAL HYSTERECTOMY N/A 12/01/2015   Procedure: XI ROBOTIC ASSISTED TOTAL HYSTERECTOMY, BILATERAL SALPINGECTOMY OOPHORECTOMY WITH SENTINAL LYMPH NODE BIOPSY;  Surgeon: [name], MD;  Location: [facility];  Service: Gynecology;  Laterality: N/A;   Family History  Problem Relation Age of Onset  . Cancer Mother 107       Lung  . Heart disease Father    Social History   Socioeconomic History  . Marital status: Widowed    Spouse name: Not on file  . Number of children: 4  .  Years of education: Not on file  . Highest education level: Not on file  Occupational History  . Not on file  Social Needs  . Financial resource strain: Not on file  . Food insecurity:    Worry: Not on file    Inability: Not on file  . Transportation needs:    Medical: No    Non-medical: No  Tobacco Use  . Smoking status: Never Smoker  . Smokeless tobacco: Never  Used  Substance and Sexual Activity  . Alcohol use: No  . Drug use: No  . Sexual activity: Not Currently    Birth control/protection: Post-menopausal  Lifestyle  . Physical activity:    Days per week: Not on file    Minutes per session: Not on file  . Stress: Not on file  Relationships  . Social connections:    Talks on phone: Not on file    Gets together: Not on file    Attends religious service: Not on file    Active member of club or organization: Not on file    Attends meetings of clubs or organizations: Not on file    Relationship status: Not on file  . Intimate partner violence:    Fear of current or ex partner: No    Emotionally abused: No    Physically abused: No    Forced sexual activity: No  Other Topics Concern  . Not on file  Social History Narrative  . Not on file    Review of systems: Constitutional:  She has no weight gain or weight loss. She has no fever or chills. Eyes: No blurred vision Ears, Nose, Mouth, Throat: No dizziness, headaches or changes in hearing. No mouth sores. Cardiovascular: No chest pain, palpitations or edema. Respiratory:  No shortness of breath, wheezing or cough Gastrointestinal: She has normal bowel movements without diarrhea or constipation. She denies any nausea or vomiting. She denies blood in her stool or heart burn. Genitourinary:  She denies pelvic pain, pelvic pressure or changes in her urinary function. She has no hematuria, dysuria, or incontinence. She has no irregular vaginal bleeding or vaginal discharge Musculoskeletal: Denies muscle weakness or joint pains.  Skin:  She has no skin changes, rashes or itching Neurological:  Denies dizziness or headaches. No neuropathy, no numbness or tingling. Psychiatric:  She denies depression or anxiety. Hematologic/Lymphatic:   No easy bruising or bleeding   Physical Exam: There were no vitals taken for this visit. General: Well dressed, well nourished in no apparent distress.    HEENT:  Normocephalic and atraumatic, no lesions.  Extraocular muscles intact. Sclerae anicteric. Pupils equal, round, reactive. No mouth sores or ulcers. Thyroid is normal size, not nodular, midline. Lungs:  Clear to auscultation bilaterally.  No wheezes. Cardiovascular:  Regular rate and rhythm.  No murmurs or rubs. Abdomen:  Soft, nontender, nondistended.  No palpable masses.  No hepatosplenomegaly.  No ascites. Normal bowel sounds.  No hernias.  Incisions are well healed. Genitourinary: upper vagina agglutinated, no lesions in vagina, no palpable masses. On the posterior urethral meatus there is a slightly erythematous protrusion of the urethral meatus. Nonfriable Extremities: No cyanosis, clubbing or edema.  No calf tenderness or erythema. No palpable cords. Psychiatric: Mood and affect are appropriate. Neurological: Awake, alert and oriented x 3. Sensation is intact, no neuropathy.  Musculoskeletal: No pain, normal strength and range of motion.  Procedure Note:  Procedure: vulva biopsy Surgeon: [name] Specimen: urethra Procedure: patient provided verbal consent, time out performed. Swabbed  urethral meatus with betadine, infiltrated with 1cc of 2% lidocaine. Used biopsy forcep to take representative section. Hemostasis achieved with silver nitrate. EBL: Chales Salmon, MD

## 2018-07-17 NOTE — Progress Notes (Signed)
Pt here today for a follow-up for endometrial cancer. Pt denies having pain. Pt states mild fatigue. Pt denies having diarrhea. Pt denies having any vaginal or rectal bleeding. Pt denies having any nausea or vomiting. Pt denies having any dysuria, hematuria, urgency and frequency. Pt denies skin changes. Pt states that she is using the dilator 1 time per week.  BP 124/71 (BP Location: Right Arm, Patient Position: Sitting)   Pulse 66   Temp 97.7 F (36.5 C) (Oral)   Resp 18   Ht 5\' 4"  (1.626 m)   Wt 124 lb 8 oz (56.5 kg)   SpO2 100%   BMI 21.37 kg/m   Wt Readings from Last 3 Encounters:  07/17/18 124 lb 8 oz (56.5 kg)  04/15/18 126 lb 12.8 oz (57.5 kg)  01/09/18 125 lb 2 oz (56.8 kg)

## 2018-07-17 NOTE — Patient Instructions (Addendum)
We will contact you with the results of your biopsy from today. Please call for any questions or concerns. If no intervention necessary, plan to follow up as previous scheduled. Plan to follow up in January 2020. Please call our office at [phone] in November or December 2019 to scheduled for Jan 2020.

## 2018-07-17 NOTE — Progress Notes (Signed)
Radiation Oncology         [phone]) [phone] ________________________________  Name: Erin Warner MRN: [id number]  Date: 07/17/2018  DOB: [birth year]  Follow-Up Visit Note  CC: Patient, No Pcp Per  [name], MD    ICD-10-CM   1. Endometrial cancer ([place]) C54.1     Diagnosis:   62 y.o. female with Recurrent Stage IA, Grade 2 endometrioid endometrial cancer  Interval Since Last Radiation:  1 year 6 months   Radiation treatment dates:   IMRT: 11/05/16 - 12/07/16  HDR: 12/13/16, 12/20/16, 12/27/16, 01/03/17  Site/dose:   Pelvis treated to 45 Gy in 25 fractions. Vaginal Cuff treated to 24 Gy in 4 fractions.  Narrative:  The patient returns today for routine follow-up.  She denies any pain or headaches. She reports mild fatigue. She denies having any more abdominal bloating, constipation, or diarrhea as a result of changing her diet. She states that she is using her vaginal dilator 1 time per week. She denies vaginal or rectal bleeding. She denies nausea or vomiting. She denies dysuria, hematuria, urgency, or frequency. She denies any skin changes.  ALLERGIES:  has No Known Allergies.  Meds: Current Outpatient Medications  Medication Sig Dispense Refill  . acetaminophen (TYLENOL) 500 MG tablet Take 500 mg by mouth every 6 (six) hours as needed for headache. Reported on 11/18/2015    . Ascorbic Acid (VITAMIN C) 1000 MG tablet Take 1,000 mg by mouth daily.    . Multiple Vitamin (MULTIVITAMIN) tablet Take 1 tablet by mouth daily.     No current facility-administered medications for this encounter.     Physical Findings: The patient is in no acute distress. Patient is alert and oriented.  height is 5\' 4"  (1.626 m) and weight is 124 lb 8 oz (56.5 kg). Her oral temperature is 97.7 F (36.5 C). Her blood pressure is 124/71 and her pulse is 66. Her respiration is 18 and oxygen saturation is 100%.   Lungs are clear to auscultation bilaterally. Heart has regular rate and rhythm. No palpable  cervical, supraclavicular, or axillary adenopathy. Abdomen soft, non-tender, normal bowel sounds.  On pelvic examination the external genitalia were unremarkable. A speculum exam was performed. At the vaginal cuff, the patient has significant radiation changes. Vaginal vault shortened and some agglutination of the proximal vagina. No obvious tumor recurrence in this area. In the periurethral region the patient has an erythematous lesion that is estimated to be approximately 3-5 mm. Patient may also have a rectocele present in the mid-posterior vaginal region. On bimanual and rectovaginal examination there were no pelvic masses appreciated.   Lab Findings: Lab Results  Component Value Date   WBC 3.3 (L) 07/11/2017   HGB 14.1 07/11/2017   HCT 42.2 07/11/2017   MCV 87.6 07/11/2017   PLT 207 07/11/2017    Radiographic Findings: No results found.  Impression:  Possible distal vaginal recurrence. The patient will see Dr. Denman George later this morning for detailed pelvic exam and possible biopsy if clinically indicated.   Plan:  Detailed gynecologic oncology exam later this morning. Otherwise follow up with Dr. Denman George in 3 months and radiation oncology in 6 months. Patient was encouraged to use her vaginal dilator on a more regular basis.  ____________________________________  Blair Promise, PhD, MD  This document serves as a record of services personally performed by Gery Pray, MD. It was created on his behalf by Rae Lips, a trained medical scribe. The creation of this record is based on the scribe's  personal observations and the provider's statements to them. This document has been checked and approved by the attending provider.

## 2018-07-21 ENCOUNTER — Telehealth: Payer: Self-pay | Admitting: Gynecologic Oncology

## 2018-07-21 NOTE — Telephone Encounter (Signed)
Left message asking patient to please call the office. Plan to discuss biopsy results.

## 2018-07-22 ENCOUNTER — Telehealth: Payer: Self-pay

## 2018-07-22 NOTE — Telephone Encounter (Addendum)
Outgoing call to patient per Joylene John NP regarding results of recent surg path report "no cancer, benign growth around the urethra." Pt voiced understanding. Pt wants to make her f/u appt for Dr Denman George. Appt made for 1-24 at 2:15 pm, arrival 1:55 pm.  Pt would like to know if she needs urologist referral for the benign growth around urethra- I told her I would ask Joylene John NP and call her back. No other needs per pt at this time.   Outgoing call to patient, per Joylene John NP , Dr Denman George notes:   "If benign, will see patient back for follow-up at 3 monthly intervals (Dr Sondra Come in October, 2019 and myself in January, 2020).If malignant, would consider referral for surgical resection."  Pt voiced understanding, no other needs per her at this time. Reminded her if she has any concerns regarding that area ie bleeding, difficulty urinating, increase in size, before her f/u appt Jan 24th to contact our office.  Pt voiced understanding.

## 2018-10-01 IMAGING — CT CT CHEST W/ CM
2 of 4 series · 15 of 36 positions shown, 18 images · IV contrast (isovue)
Comparison: 10/09/2016.

CLINICAL DATA: Endometrial cancer

EXAM:
CT CHEST WITH CONTRAST
TECHNIQUE: Multidetector CT imaging of the chest was performed during
intravenous contrast administration.
CONTRAST:  Contrast 75 cc Isovue 300

[Series 2: axial st · axial · 0.61mm/px · z∈[-327,-43]mm · 12 of 164 slices shown, 15 images]
[im 11/164  mediastinal]
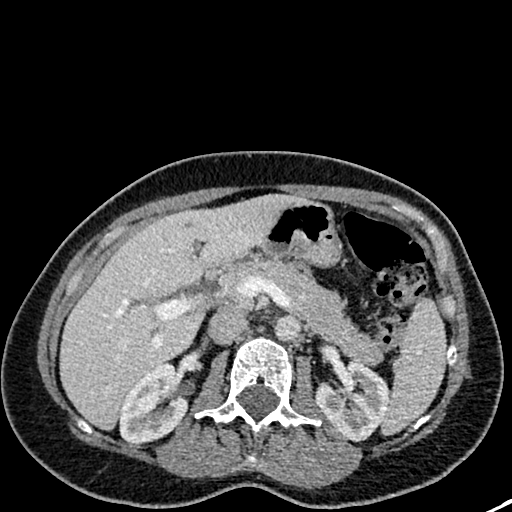
[im 11/164  lung]
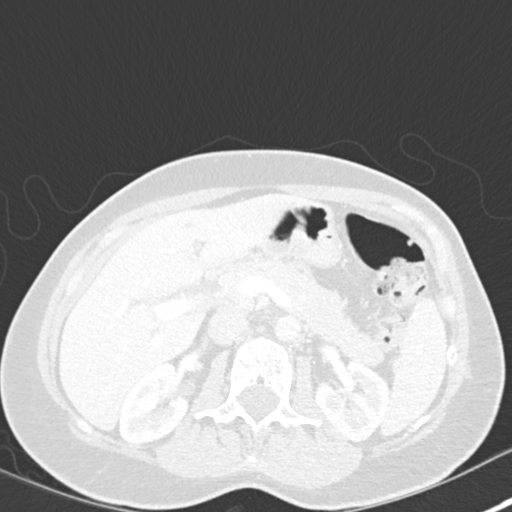
[im 22/164  lung]
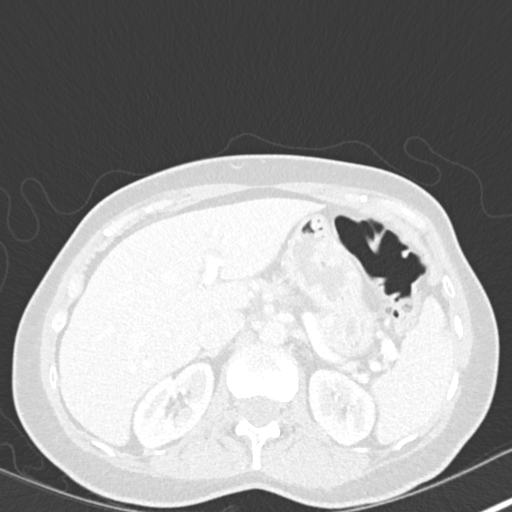
[im 33/164  lung]
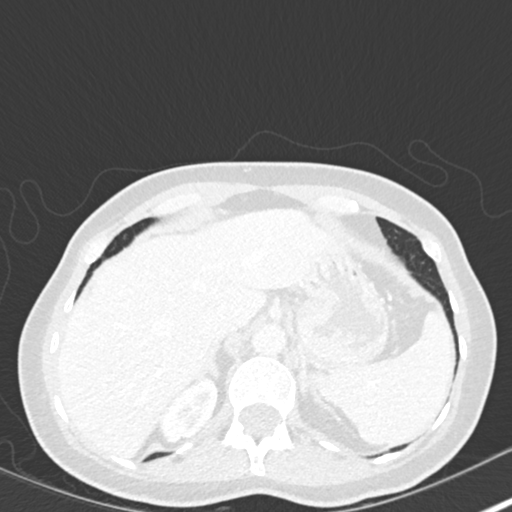
[im 55/164  lung]
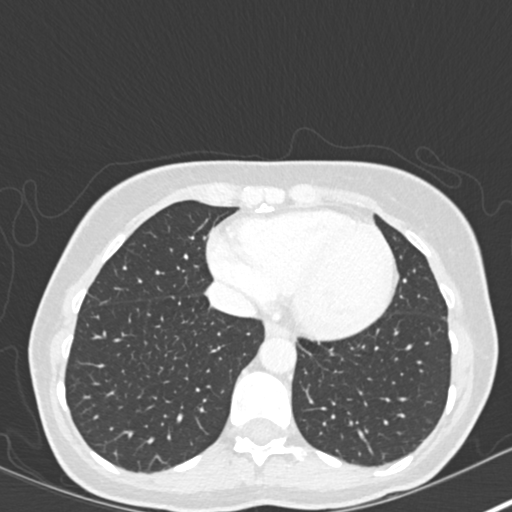
[im 66/164  mediastinal]
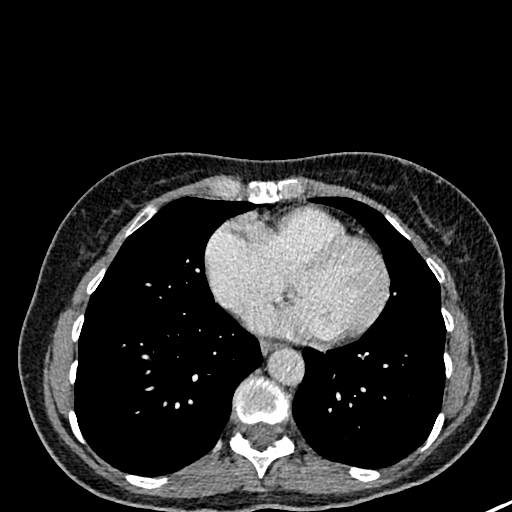
[im 66/164  lung]
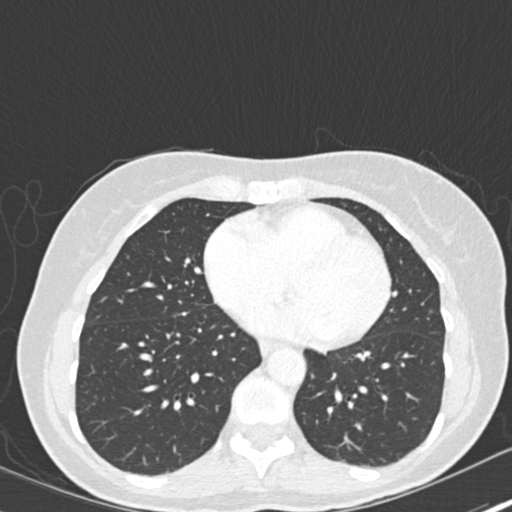
[im 77/164  lung]
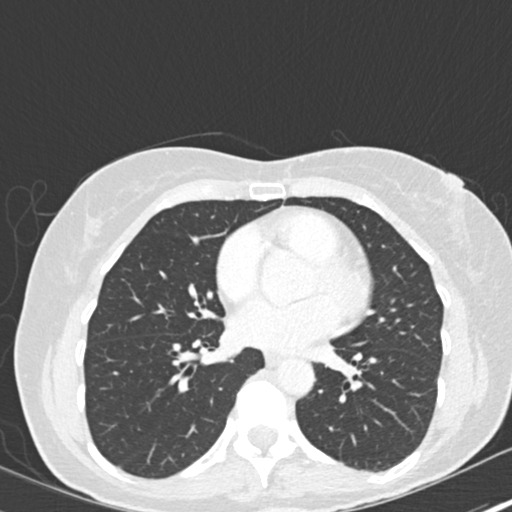
[im 87/164  lung]
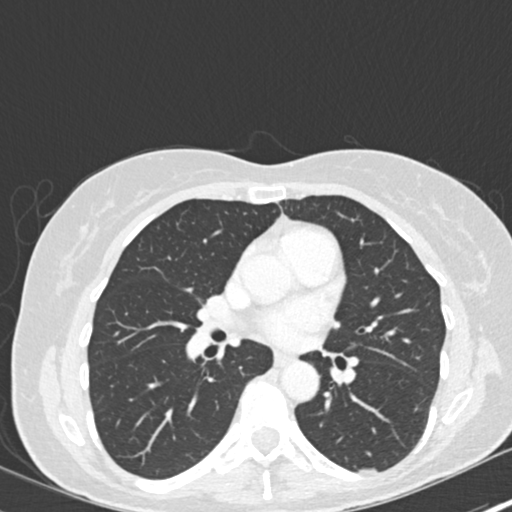
[im 98/164  lung]
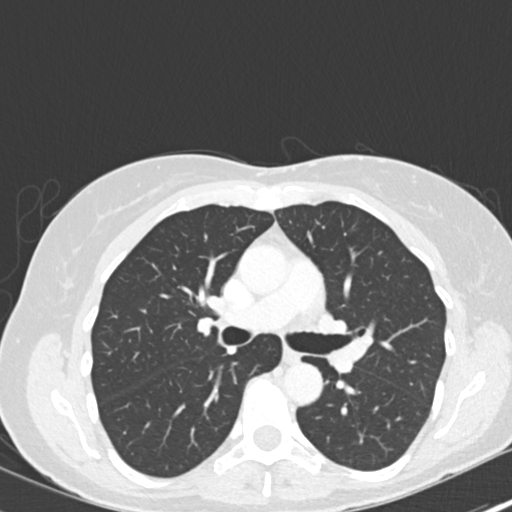
[im 109/164  mediastinal]
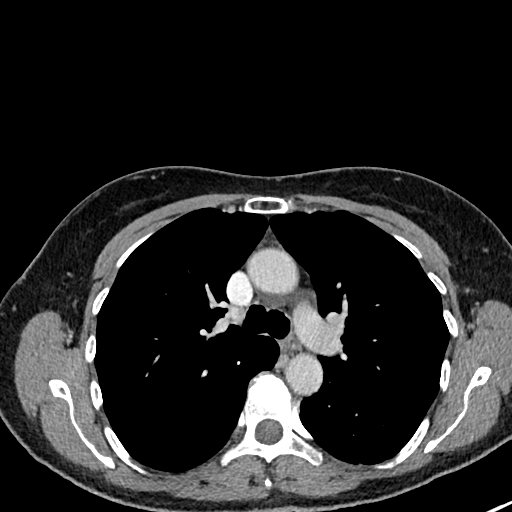
[im 109/164  lung]
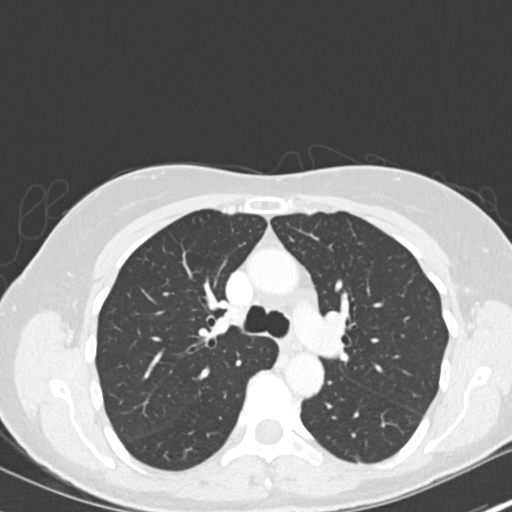
[im 131/164  lung]
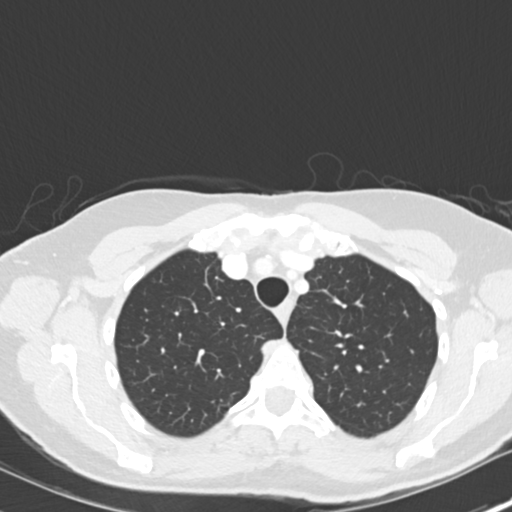
[im 142/164  lung]
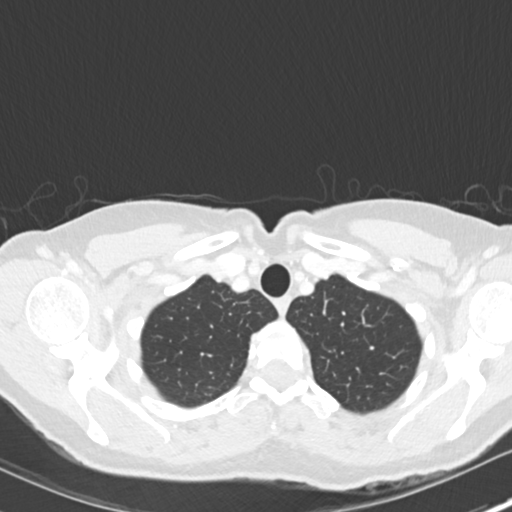
[im 153/164  lung]
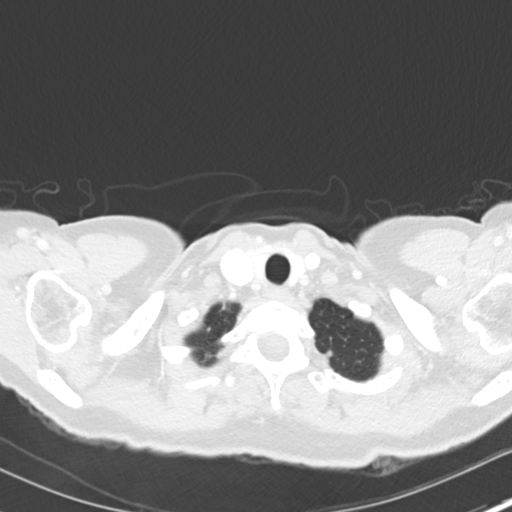

[Series 8: coronal · coronal · 0.56mm/px · 3 of 100 slices shown]
[im 20/100  lung]
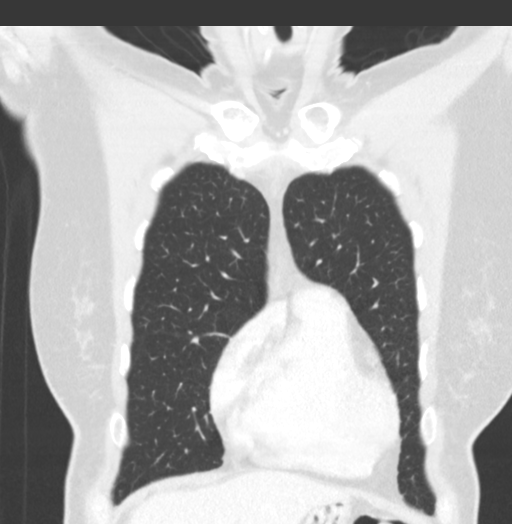
[im 40/100  lung]
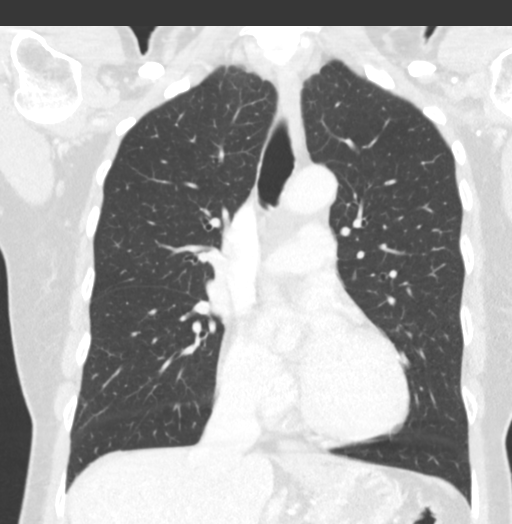
[im 60/100  lung]
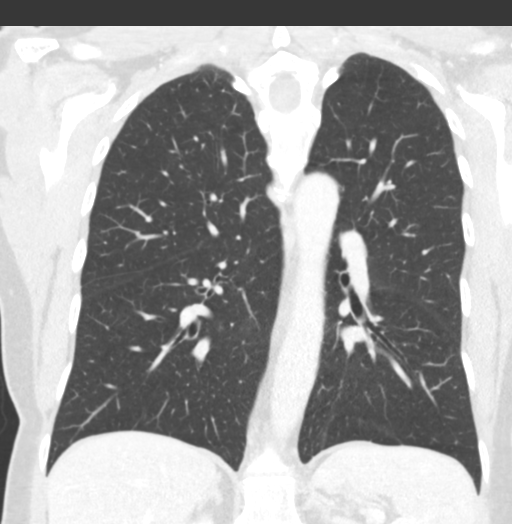

[15 of 36 positions shown; findings below may reference images not displayed]

FINDINGS: Cardiovascular: The heart size is normal. No pericardial effusion.
No thoracic aortic aneurysm.

Mediastinum/Nodes: No mediastinal lymphadenopathy. There is no hilar
lymphadenopathy. The esophagus has normal imaging features. There is
no axillary lymphadenopathy.

Lungs/Pleura: 4 mm posterior left upper lobe pulmonary nodule (image
32 series 7) is stable. Lungs are otherwise clear.

Upper Abdomen: Multiple cysts are again identified within the liver
parenchyma. There are several scattered tiny hypodensities in the
liver the remain too small to characterize but are unchanged in the
interval. No adrenal nodule or mass.

Musculoskeletal: Bone windows reveal no worrisome lytic or sclerotic
osseous lesions.
IMPRESSION: 1. Stable exam.  No new or progressive findings.
2. 4 mm posterior left upper lobe pulmonary nodule, reassuringly
stable in the 10 month interval since prior study. Continued
attention on follow-up recommended.
3. Multiple hepatic cysts are unchanged and multiple hypoattenuating
liver lesions, too small to characterize, are similar to prior.

## 2018-10-17 ENCOUNTER — Inpatient Hospital Stay: Payer: BLUE CROSS/BLUE SHIELD | Attending: Gynecologic Oncology | Admitting: Gynecologic Oncology

## 2018-10-17 ENCOUNTER — Encounter: Payer: Self-pay | Admitting: Gynecologic Oncology

## 2018-10-17 VITALS — BP 120/70 | HR 68 | Temp 98.4°F | Resp 20 | Ht 64.0 in | Wt 122.6 lb

## 2018-10-17 DIAGNOSIS — C541 Malignant neoplasm of endometrium: Secondary | ICD-10-CM

## 2018-10-17 DIAGNOSIS — Z923 Personal history of irradiation: Secondary | ICD-10-CM

## 2018-10-17 DIAGNOSIS — Z90722 Acquired absence of ovaries, bilateral: Secondary | ICD-10-CM

## 2018-10-17 DIAGNOSIS — Z90710 Acquired absence of both cervix and uterus: Secondary | ICD-10-CM

## 2018-10-17 NOTE — Progress Notes (Signed)
ENDOMETRIAL CANCER FOLLOW-UP  Assessment:    63 y.o. 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   Complete clinical response on exam  Plan: She will see Dr Sondra Come in 3 months and myself in 6 months after which she will transition to 6 monthly follow-up visits.   HPI:  Erin Warner is a 63 y.o. year old [id number] initially seen in consultation on 11/18/15 referred by Dr Hulan Fray for grade 2 endometrial cancer.  She then underwent a robotic assisted total hysterectomy, BSO, SLN biopsy on 02/23/93 without complications.  Her postoperative course was uncomplicated.  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 Given the low risk features on pathology, no adjuvant therapy was recommended in accordance with NCCN guidelines.  She was seen by Dr Hulan Fray on 09/28/16 for routine follow-up of her low risk endometrial cancer. 2 5-37mm nodules were seen at the right aspect of the vaginal cuff. The areas were entirely resected (grossly) with the biopsy forcep. Final pathology confirme recurrence of endometrioid adenocarcinoma, consistent with recurrent endometrial cancer.  On 116/18 she had restaging CT of chest, abdomen, pelvis. This showed no evidence of metastatic disease in the abdomen or pelvis and no adenopathy. No discrete mass was seen in the vagina. A solitary 59mm LUL pulmonary nodule was identified in the chest, favored benign.  She received salvage radiation with external beam radiation to the pelvis 45 Gy in 25 fractions and 24 Gy to the vaginal cuff in 4 fractions between 11/05/16 - 01/03/17.   Interval Hx: She has no symptoms of bleeding, discomfort, difficulty with urination. 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  She occasional has bulge symptoms. No bleeding.   Current Outpatient Medications on File Prior to Visit  Medication Sig Dispense Refill  . acetaminophen (TYLENOL) 500 MG tablet Take 500 mg by mouth every 6 (six) hours as needed for headache. Reported on 11/18/2015    . Multiple Vitamin (MULTIVITAMIN) tablet Take 1 tablet by mouth daily.     No current facility-administered medications on file prior to visit.    No Known Allergies Past Medical History:  Diagnosis Date  . Cancer ([facility])    basal cell, dx.2'17  cervical cancer- surgery planned.  Marland Kitchen History of radiation therapy 11/05/2016 - 12/07/2016   IMRT Pelvis 45 Gy 25 fractions  . History of radiation therapy 12/13/2016, 12/20/2016, 12/27/2016, 01/03/2017   Vaginal Cuff HDR 24 Gy 4 fractions   Past Surgical History:  Procedure Laterality Date  . basal cell removal  2016   back  . BUNIONECTOMY  2009  . ROBOTIC ASSISTED TOTAL HYSTERECTOMY N/A 12/01/2015   Procedure: XI ROBOTIC ASSISTED TOTAL HYSTERECTOMY, BILATERAL SALPINGECTOMY OOPHORECTOMY WITH SENTINAL LYMPH NODE BIOPSY;  Surgeon: [name], MD;  Location: [facility];  Service: Gynecology;  Laterality: N/A;   Family History  Problem Relation Age of Onset  . Cancer Mother 63       Lung  . Heart disease Father    Social History   Socioeconomic History  . Marital status: Widowed    Spouse name: Not on file  . Number of children: 4  . Years of education: Not on file  . Highest education level: Not on file  Occupational History  . Not on file  Social Needs  . Financial resource strain: Not on file  . Food insecurity:    Worry: Not on file    Inability: Not on file  . Transportation needs:    Medical: No    Non-medical: No  Tobacco Use  . Smoking status: Never Smoker  . Smokeless tobacco: Never Used  Substance and Sexual Activity  . Alcohol use: No  . Drug use: No  . Sexual activity: Not Currently    Birth control/protection:  Post-menopausal  Lifestyle  . Physical activity:    Days per week: Not on file    Minutes per session: Not on file  . Stress: Not on file  Relationships  . Social connections:    Talks on phone: Not on file    Gets together: Not on file    Attends religious service: Not on file    Active member of club or organization: Not on file    Attends meetings of clubs or organizations: Not on file    Relationship status: Not on file  . Intimate partner violence:    Fear of current or ex partner: No    Emotionally abused: No    Physically abused: No    Forced sexual activity: No  Other Topics Concern  . Not on file  Social History Narrative  . Not on file    Review of systems: Constitutional:  She has no weight gain or weight loss. She has no fever or chills. Eyes: No blurred vision Ears, Nose, Mouth, Throat: No dizziness, headaches or changes in hearing. No mouth sores. Cardiovascular: No chest pain, palpitations or edema. Respiratory:  No shortness of breath, wheezing or cough Gastrointestinal: She has normal bowel movements without diarrhea or constipation. She denies any nausea or vomiting. She denies blood in her stool or heart burn. Genitourinary:  She denies pelvic pain, pelvic pressure or changes in her urinary function. She has no hematuria, dysuria, or incontinence. She has no irregular vaginal bleeding or vaginal discharge Musculoskeletal: Denies muscle weakness or joint pains.  Skin:  She has no skin changes, rashes or itching Neurological:  Denies dizziness or headaches. No neuropathy, no numbness or tingling. Psychiatric:  She denies depression or anxiety. Hematologic/Lymphatic:   No easy bruising or bleeding   Physical Exam: Blood pressure 120/70, pulse 68, temperature 98.4 F (36.9 C), temperature source Oral, resp. rate 20, height 5\' 4"  (1.626 m), weight 122 lb 9.6 oz (55.6 kg), SpO2 100 %. General: Well dressed, well nourished in no apparent distress.   HEENT:   Normocephalic and atraumatic, no lesions.  Extraocular muscles intact. Sclerae anicteric. Pupils equal, round, reactive. No mouth sores or ulcers. Thyroid is normal size, not nodular, midline. Lungs:  Clear to auscultation bilaterally.  No wheezes. Cardiovascular:  Regular rate and rhythm.  No murmurs or rubs. Abdomen:  Soft, nontender, nondistended.  No palpable masses.  No hepatosplenomegaly.  No ascites. Normal bowel sounds.  No hernias.  Incisions are well healed. Genitourinary: upper vagina agglutinated, no lesions in vagina, no palpable masses. On the posterior urethral meatus there is a slightly erythematous protrusion of the urethral meatus. Nonfriable. Posterior vaginal wall is thickened, but soft and mobile and not consistent with recurrence. Slight cording of the distal right introitus.  Extremities: No cyanosis, clubbing or edema.  No calf tenderness or erythema. No palpable cords. Psychiatric: Mood and affect are appropriate. Neurological: Awake, alert and oriented x 3. Sensation is intact, no neuropathy.  Musculoskeletal: No pain, normal strength and range of motion.   Thereasa Solo, MD

## 2018-10-17 NOTE — Patient Instructions (Signed)
Please notify Dr Denman George at phone number [phone] if you notice vaginal bleeding, new pelvic or abdominal pains, bloating, feeling full easy, or a change in bladder or bowel function.   Please return to see Dr Sondra Come in 3 months, and after that appointment please have their office contact ours for an appointment in July, 2020.

## 2018-10-21 ENCOUNTER — Telehealth: Payer: Self-pay | Admitting: General Practice

## 2018-10-21 NOTE — Telephone Encounter (Signed)
Sal Tomaso (son) at check stating dr g said he would take pt as new patient.  He will have mom call to schedule

## 2019-01-22 ENCOUNTER — Ambulatory Visit
Admission: RE | Admit: 2019-01-22 | Discharge: 2019-01-22 | Disposition: A | Discharge status: Home / Self Care | Payer: BLUE CROSS/BLUE SHIELD | Source: Ambulatory Visit | Attending: Radiation Oncology | Admitting: Radiation Oncology

## 2019-01-22 ENCOUNTER — Other Ambulatory Visit: Payer: Self-pay

## 2019-01-22 ENCOUNTER — Encounter: Payer: Self-pay | Admitting: Radiation Oncology

## 2019-01-22 VITALS — BP 121/61 | HR 67 | Temp 98.2°F | Resp 18 | Ht 64.0 in | Wt 120.4 lb

## 2019-01-22 DIAGNOSIS — Z923 Personal history of irradiation: Secondary | ICD-10-CM | POA: Diagnosis not present

## 2019-01-22 DIAGNOSIS — Z8542 Personal history of malignant neoplasm of other parts of uterus: Secondary | ICD-10-CM | POA: Diagnosis present

## 2019-01-22 DIAGNOSIS — C7982 Secondary malignant neoplasm of genital organs: Secondary | ICD-10-CM

## 2019-01-22 DIAGNOSIS — C541 Malignant neoplasm of endometrium: Secondary | ICD-10-CM

## 2019-01-22 NOTE — Progress Notes (Signed)
Radiation Oncology         [id number]) [phone] ________________________________  Name: Erin Warner MRN: [id number]  Date: 01/22/2019  DOB: Feb 22, 1956  Follow-Up Visit Note  CC: Patient, No Pcp Per  [name], MD    ICD-10-CM   1. 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 Endometrial carcinoma (HCC) C54.1     Diagnosis:   63 y.o. female with Recurrent Stage IA, Grade 2 endometrioid endometrial cancer  Interval Since Last Radiation:  2 years Radiation treatment dates:   IMRT: 11/05/16 - 12/07/16 to the pelvis HDR: 12/13/16, 12/20/16, 12/27/16, 01/03/17 Site/dose:   Pelvis treated to 45 Gy in 25 fractions Vaginal Cuff treated to 24 Gy in 4 fractions  Narrative:  The patient returns today for routine follow-up.  During her last visit in radiation oncology, she was noted to have possible distal vaginal recurrence and was further evaluated by Dr. Denman George. Biopsy of the urethral meatus on 07/17/2018 revealed benign squamous mucosa with focal inflammation and reactive changes, consistent with urethral caruncle. No evidence of malignancy. The patient was last seen by Dr. Denman George in January 2020 with complete clinical response on exam.   On review of systems, the patient denies any pain. She denies dysuria or hematuria. She denies vaginal bleeding or discharge. She denies rectal bleeding, constipation, or diarrhea.                      ALLERGIES:  has No Known Allergies.  Meds: Current Outpatient Medications  Medication Sig Dispense Refill  . acetaminophen (TYLENOL) 500 MG tablet Take 500 mg by mouth every 6 (six) hours as needed for headache. Reported on 11/18/2015    . Multiple Vitamin (MULTIVITAMIN) tablet Take 1 tablet by mouth daily.     No current facility-administered medications for this encounter.     Physical Findings: The patient is in no acute distress. Patient is alert and oriented.  height is 5\' 4"  (1.626 m) and weight is 120 lb 6 oz (54.6 kg). Her oral  temperature is 98.2 F (36.8 C). Her blood pressure is 121/61 and her pulse is 67. Her respiration is 18 and oxygen saturation is 99%.   Lungs are clear to auscultation bilaterally. Heart has regular rate and rhythm. No palpable cervical, supraclavicular, or axillary adenopathy. Abdomen soft, non-tender, normal bowel sounds.  On pelvic examination the external genitalia were unremarkable. A speculum exam was performed. There are no mucosal lesions noted in the vaginal vault. On bimanual examination there were no pelvic masses appreciated. Posterior vaginal wall is thickened, but soft and mobile, not consistent with recurrence.  Lab Findings: Lab Results  Component Value Date   WBC 3.3 (L) 07/11/2017   HGB 14.1 07/11/2017   HCT 42.2 07/11/2017   MCV 87.6 07/11/2017   PLT 207 07/11/2017    Radiographic Findings: No results found.  Impression:  Recurrent Stage IA, Grade 2 endometrioid endometrial cancer. No evidence of recurrence on clinical exam.   Plan:  She will see Dr. Denman George in 3 months. We will then transition follow-ups to every 6 months, and therefore I will see her in 9 months.  ____________________________________  Blair Promise, PhD, MD  This document serves as a record of services personally performed by Gery Pray, MD. It was created on his behalf by Rae Lips, a trained medical scribe. The creation of this record is based on the scribe's personal observations and the provider's statements to them. This document has been checked  and approved by the attending provider.

## 2019-01-22 NOTE — Patient Instructions (Signed)
Coronavirus (COVID-19) Are you at risk?  Are you at risk for the Coronavirus (COVID-19)?  To be considered HIGH RISK for Coronavirus (COVID-19), you have to meet the following criteria:  . Traveled to [place], [place], [place], [place] or [place]; or in the [place] to [place], [place], [place], or [place]; and have fever, cough, and shortness of breath within the last 2 weeks of travel OR . Been in close contact with a person diagnosed with COVID-19 within the last 2 weeks and have fever, cough, and shortness of breath . IF YOU DO NOT MEET THESE CRITERIA, YOU ARE CONSIDERED LOW RISK FOR COVID-19.  What to do if you are HIGH RISK for COVID-19?  . If you are having a medical emergency, call 911. . Seek medical care right away. Before you go to a doctor's office, urgent care or emergency department, call ahead and tell them about your recent travel, contact with someone diagnosed with COVID-19, and your symptoms. You should receive instructions from your physician's office regarding next steps of care.  . When you arrive at healthcare provider, tell the healthcare staff immediately you have returned from visiting [place], [place], [place], [place] or [place]; or traveled in the [place] to [place], [place], [place], or [place]; in the last two weeks or you have been in close contact with a person diagnosed with COVID-19 in the last 2 weeks.   . Tell the health care staff about your symptoms: fever, cough and shortness of breath. . After you have been seen by a medical provider, you will be either: o Tested for (COVID-19) and discharged home on quarantine except to seek medical care if symptoms worsen, and asked to  - Stay home and avoid contact with others until you get your results (4-5 days)  - Avoid travel on public transportation if possible (such as bus, train, or airplane) or o Sent to the Emergency Department by EMS for evaluation, COVID-19 testing, and possible  admission depending on your condition and test results.  What to do if you are LOW RISK for COVID-19?  Reduce your risk of any infection by using the same precautions used for avoiding the common cold or flu:  . Wash your hands often with soap and warm water for at least 20 seconds.  If soap and water are not readily available, use an alcohol-based hand sanitizer with at least 60% alcohol.  . If coughing or sneezing, cover your mouth and nose by coughing or sneezing into the elbow areas of your shirt or coat, into a tissue or into your sleeve (not your hands). . Avoid shaking hands with others and consider head nods or verbal greetings only. . Avoid touching your eyes, nose, or mouth with unwashed hands.  . Avoid close contact with people who are sick. . Avoid places or events with large numbers of people in one location, like concerts or sporting events. . Carefully consider travel plans you have or are making. . If you are planning any travel outside or inside the [place], visit the CDC's Travelers' Health webpage for the latest health notices. . If you have some symptoms but not all symptoms, continue to monitor at home and seek medical attention if your symptoms worsen. . If you are having a medical emergency, call 911.   ADDITIONAL HEALTHCARE OPTIONS FOR PATIENTS  [facility] / e-Visit: [email or web address]         [facility]: [phone]  [facility]: [phone]                   [facility]: [phone]   

## 2019-01-22 NOTE — Progress Notes (Signed)
Pt here for f/u with Dr.Kinard. Pt denies pain. Pt denies dysuria/hematuria with urination. Pt denies bleeding or discharge from vagina. Pt denies rectal bleeding/constipation, diarrhea.   BP 121/61 (BP Location: Left Arm, Patient Position: Sitting)   Pulse 67   Temp 98.2 F (36.8 C) (Oral)   Resp 18   Ht 5\' 4"  (1.626 m)   Wt 120 lb 6 oz (54.6 kg)   SpO2 99%   BMI 20.66 kg/m    Wt Readings from Last 3 Encounters:  01/22/19 120 lb 6 oz (54.6 kg)  10/17/18 122 lb 9.6 oz (55.6 kg)  07/17/18 124 lb 8 oz (56.5 kg)

## 2019-02-23 ENCOUNTER — Telehealth: Payer: Self-pay | Admitting: *Deleted

## 2019-02-23 NOTE — Telephone Encounter (Signed)
Called patient to inform of fu with Dr. Denman George  on 03-25-19 - arrival time - 1:45 pm, spoke with patient and she is aware of this appt.

## 2019-02-23 NOTE — Telephone Encounter (Signed)
Enid Derry from radiation called and scheduled the patient for an appt in July. She will contact the patient

## 2019-03-25 ENCOUNTER — Encounter: Payer: Self-pay | Admitting: Gynecologic Oncology

## 2019-03-25 ENCOUNTER — Other Ambulatory Visit: Payer: Self-pay

## 2019-03-25 ENCOUNTER — Inpatient Hospital Stay: Payer: BC Managed Care – PPO | Attending: Gynecologic Oncology | Admitting: Gynecologic Oncology

## 2019-03-25 VITALS — BP 109/74 | HR 68 | Temp 98.9°F | Resp 18 | Ht 64.0 in | Wt 123.0 lb

## 2019-03-25 DIAGNOSIS — Z90710 Acquired absence of both cervix and uterus: Secondary | ICD-10-CM | POA: Diagnosis not present

## 2019-03-25 DIAGNOSIS — Z90722 Acquired absence of ovaries, bilateral: Secondary | ICD-10-CM | POA: Diagnosis not present

## 2019-03-25 DIAGNOSIS — Z923 Personal history of irradiation: Secondary | ICD-10-CM | POA: Diagnosis not present

## 2019-03-25 DIAGNOSIS — C541 Malignant neoplasm of endometrium: Secondary | ICD-10-CM

## 2019-03-25 NOTE — Progress Notes (Addendum)
ENDOMETRIAL CANCER FOLLOW-UP  Assessment:    63 y.o. 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   Complete clinical response on exam  Plan: She will see Dr Sondra Come in 6 months and myself in 12 months.   HPI:  Erin Warner is a 63 y.o. year old [id number] initially seen in consultation on 11/18/15 referred by Dr Hulan Fray for grade 2 endometrial cancer.  She then underwent a robotic assisted total hysterectomy, BSO, SLN biopsy on 05/01/55 without complications.  Her postoperative course was uncomplicated.  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 Given the low risk features on pathology, no adjuvant therapy was recommended in accordance with NCCN guidelines.  She was seen by Dr Hulan Fray on 09/28/16 for routine follow-up of her low risk endometrial cancer. 2 5-41mm nodules were seen at the right aspect of the vaginal cuff. The areas were entirely resected (grossly) with the biopsy forcep. Final pathology confirme recurrence of endometrioid adenocarcinoma, consistent with recurrent endometrial cancer.  On 116/18 she had restaging CT of chest, abdomen, pelvis. This showed no evidence of metastatic disease in the abdomen or pelvis and no adenopathy. No discrete mass was seen in the vagina. A solitary 60mm LUL pulmonary nodule was identified in the chest, favored benign.  She received salvage radiation with external beam radiation to the pelvis 45 Gy in 25 fractions and 24 Gy to the vaginal cuff in 4 fractions between 11/05/16 - 01/03/17.   Interval Hx: She has no symptoms of bleeding, discomfort, difficulty with urination.  She has no bleeding.  Today we reviewed survivorship issues including sexual function, fatigue, radiation toxicity.   Current Outpatient Medications on File Prior to Visit   Medication Sig Dispense Refill  . acetaminophen (TYLENOL) 500 MG tablet Take 500 mg by mouth every 6 (six) hours as needed for headache. Reported on 11/18/2015    . Multiple Vitamin (MULTIVITAMIN) tablet Take 1 tablet by mouth daily.     No current facility-administered medications on file prior to visit.    No Known Allergies Past Medical History:  Diagnosis Date  . Cancer ([place])    basal cell, dx.2'17  cervical cancer- surgery planned.  Marland Kitchen History of radiation therapy 11/05/2016 - 12/07/2016   IMRT Pelvis 45 Gy 25 fractions  . History of radiation therapy 12/13/2016, 12/20/2016, 12/27/2016, 01/03/2017   Vaginal Cuff HDR 24 Gy 4 fractions   Past Surgical History:  Procedure Laterality Date  . basal cell removal  2016   back  . BUNIONECTOMY  2009  . ROBOTIC ASSISTED TOTAL HYSTERECTOMY N/A 12/01/2015   Procedure: XI ROBOTIC ASSISTED TOTAL HYSTERECTOMY, BILATERAL SALPINGECTOMY OOPHORECTOMY WITH SENTINAL LYMPH NODE BIOPSY;  Surgeon: [name], MD;  Location: [facility];  Service: Gynecology;  Laterality: N/A;   Family History  Problem Relation Age of Onset  . Cancer Mother 50       Lung  . Heart disease Father    Social History   Socioeconomic History  . Marital status: Widowed    Spouse name: Not on file  . Number of children: 4  . Years of education: Not on file  . Highest education level: Not on file  Occupational History  . Not on file  Social Needs  . Financial resource strain: Not on file  . Food insecurity    Worry: Not on  file    Inability: Not on file  . Transportation needs    Medical: No    Non-medical: No  Tobacco Use  . Smoking status: Never Smoker  . Smokeless tobacco: Never Used  Substance and Sexual Activity  . Alcohol use: No  . Drug use: No  . Sexual activity: Not Currently    Birth control/protection: Post-menopausal  Lifestyle  . Physical activity    Days per week: Not on file    Minutes per session: Not on file  . Stress: Not on file   Relationships  . Social Herbalist on phone: Not on file    Gets together: Not on file    Attends religious service: Not on file    Active member of club or organization: Not on file    Attends meetings of clubs or organizations: Not on file    Relationship status: Not on file  . Intimate partner violence    Fear of current or ex partner: No    Emotionally abused: No    Physically abused: No    Forced sexual activity: No  Other Topics Concern  . Not on file  Social History Narrative  . Not on file    Review of systems: Constitutional:  She has no weight gain or weight loss. She has no fever or chills. Eyes: No blurred vision Ears, Nose, Mouth, Throat: No dizziness, headaches or changes in hearing. No mouth sores. Cardiovascular: No chest pain, palpitations or edema. Respiratory:  No shortness of breath, wheezing or cough Gastrointestinal: She has normal bowel movements without diarrhea or constipation. She denies any nausea or vomiting. She denies blood in her stool or heart burn. Genitourinary:  She denies pelvic pain, pelvic pressure or changes in her urinary function. She has no hematuria, dysuria, or incontinence. She has no irregular vaginal bleeding or vaginal discharge Musculoskeletal: Denies muscle weakness or joint pains.  Skin:  She has no skin changes, rashes or itching Neurological:  Denies dizziness or headaches. No neuropathy, no numbness or tingling. Psychiatric:  She denies depression or anxiety. Hematologic/Lymphatic:   No easy bruising or bleeding   Physical Exam: Blood pressure 109/74, pulse 68, temperature 98.9 F (37.2 C), temperature source Oral, resp. rate 18, height 5\' 4"  (1.626 m), weight 123 lb (55.8 kg), SpO2 98 %. General: Well dressed, well nourished in no apparent distress.   HEENT:  Normocephalic and atraumatic, no lesions.  Extraocular muscles intact. Sclerae anicteric. Pupils equal, round, reactive. No mouth sores or ulcers. Thyroid  is normal size, not nodular, midline. Lungs:  Clear to auscultation bilaterally.  No wheezes. Cardiovascular:  Regular rate and rhythm.  No murmurs or rubs. Abdomen:  Soft, nontender, nondistended.  No palpable masses.  No hepatosplenomegaly.  No ascites. Normal bowel sounds.  No hernias.  Incisions are well healed. Genitourinary: upper vagina agglutinated, no lesions in vagina, no palpable masses. Shortened vagina. Posterior vaginal wall is thickened, but soft and mobile and not consistent with recurrence. Slight cording of the distal right introitus.  Extremities: No cyanosis, clubbing or edema.  No calf tenderness or erythema. No palpable cords. Psychiatric: Mood and affect are appropriate. Neurological: Awake, alert and oriented x 3. Sensation is intact, no neuropathy.  Musculoskeletal: No pain, normal strength and range of motion.   Thereasa Solo, MD

## 2019-03-25 NOTE — Patient Instructions (Addendum)
  Please notify Dr Denman George at phone number [phone] if you notice vaginal bleeding, new pelvic or abdominal pains, bloating, feeling full easy, or a change in bladder or bowel function.   Please contact Dr Serita Grit office (at [phone]) in March, 2021 to request an appointment with her for July, 2021.

## 2019-10-22 ENCOUNTER — Encounter: Payer: Self-pay | Admitting: Radiation Oncology

## 2019-10-22 ENCOUNTER — Other Ambulatory Visit: Payer: Self-pay

## 2019-10-22 ENCOUNTER — Ambulatory Visit
Admission: RE | Admit: 2019-10-22 | Discharge: 2019-10-22 | Disposition: A | Discharge status: Home / Self Care | Payer: BC Managed Care – PPO | Source: Ambulatory Visit | Attending: Radiation Oncology | Admitting: Radiation Oncology

## 2019-10-22 VITALS — BP 119/67 | HR 73 | Temp 97.0°F | Resp 20 | Wt 124.4 lb

## 2019-10-22 DIAGNOSIS — Z923 Personal history of irradiation: Secondary | ICD-10-CM | POA: Diagnosis not present

## 2019-10-22 DIAGNOSIS — C541 Malignant neoplasm of endometrium: Secondary | ICD-10-CM

## 2019-10-22 DIAGNOSIS — Z8542 Personal history of malignant neoplasm of other parts of uterus: Secondary | ICD-10-CM | POA: Insufficient documentation

## 2019-10-22 DIAGNOSIS — C7982 Secondary malignant neoplasm of genital organs: Secondary | ICD-10-CM

## 2019-10-22 NOTE — Patient Instructions (Signed)
Coronavirus (COVID-19) Are you at risk?  Are you at risk for the Coronavirus (COVID-19)?  To be considered HIGH RISK for Coronavirus (COVID-19), you have to meet the following criteria:  . Traveled to [place], [place], [place], [place] or [place]; or in the [place] to [place], [place], [place], or [place]; and have fever, cough, and shortness of breath within the last 2 weeks of travel OR . Been in close contact with a person diagnosed with COVID-19 within the last 2 weeks and have fever, cough, and shortness of breath . IF YOU DO NOT MEET THESE CRITERIA, YOU ARE CONSIDERED LOW RISK FOR COVID-19.  What to do if you are HIGH RISK for COVID-19?  . If you are having a medical emergency, call 911. . Seek medical care right away. Before you go to a doctor's office, urgent care or emergency department, call ahead and tell them about your recent travel, contact with someone diagnosed with COVID-19, and your symptoms. You should receive instructions from your physician's office regarding next steps of care.  . When you arrive at healthcare provider, tell the healthcare staff immediately you have returned from visiting [place], [place], [place], [place] or [place]; or traveled in the [place] to [place], [place], [place], or [place]; in the last two weeks or you have been in close contact with a person diagnosed with COVID-19 in the last 2 weeks.   . Tell the health care staff about your symptoms: fever, cough and shortness of breath. . After you have been seen by a medical provider, you will be either: o Tested for (COVID-19) and discharged home on quarantine except to seek medical care if symptoms worsen, and asked to  - Stay home and avoid contact with others until you get your results (4-5 days)  - Avoid travel on public transportation if possible (such as bus, train, or airplane) or o Sent to the Emergency Department by EMS for evaluation, COVID-19 testing, and possible  admission depending on your condition and test results.  What to do if you are LOW RISK for COVID-19?  Reduce your risk of any infection by using the same precautions used for avoiding the common cold or flu:  . Wash your hands often with soap and warm water for at least 20 seconds.  If soap and water are not readily available, use an alcohol-based hand sanitizer with at least 60% alcohol.  . If coughing or sneezing, cover your mouth and nose by coughing or sneezing into the elbow areas of your shirt or coat, into a tissue or into your sleeve (not your hands). . Avoid shaking hands with others and consider head nods or verbal greetings only. . Avoid touching your eyes, nose, or mouth with unwashed hands.  . Avoid close contact with people who are sick. . Avoid places or events with large numbers of people in one location, like concerts or sporting events. . Carefully consider travel plans you have or are making. . If you are planning any travel outside or inside the [place], visit the CDC's Travelers' Health webpage for the latest health notices. . If you have some symptoms but not all symptoms, continue to monitor at home and seek medical attention if your symptoms worsen. . If you are having a medical emergency, call 911.   ADDITIONAL HEALTHCARE OPTIONS FOR PATIENTS  [facility] / e-Visit: [email or web address]         [facility]: [phone]  [facility]: [phone]                   [facility]: [phone]   

## 2019-10-22 NOTE — Progress Notes (Signed)
  Radiation Oncology         [phone]) [phone] ________________________________  Name: Erin Warner MRN: [id number]  Date: 10/22/2019  DOB: 1956/09/05  Follow-Up Visit Note  CC: Patient, No Pcp Per  Erin Amber, MD    ICD-10-CM   1. Endometrial carcinoma (HCC)  C54.1   2. 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 female with Recurrent Stage IA, Grade 2 endometrioid endometrial cancer  Interval Since Last Radiation:  2 years, 9 months 11/05/16 - 12/07/16: Pelvis / 45 Gy in 25 fractions (IMRT) 3/22, 3/29, 4/5, 01/03/17: Vaginal Cuff treated to 24 Gy in 4 fractions  Narrative:  The patient returns today for routine follow-up. She was last seen by Dr. Denman George in 03/2019 with complete clinical response on exam.   On review of systems, she is without complaints today. She denies pain, dysuria or hematuria, vaginal discharge or bleeding, rectal bleeding, diarrhea or constipation, abdominal bloating, and nausea or vomiting.  She is not using a vaginal dilator at this time.  ALLERGIES:  has No Known Allergies.  Meds: Current Outpatient Medications  Medication Sig Dispense Refill  . acetaminophen (TYLENOL) 500 MG tablet Take 500 mg by mouth every 6 (six) hours as needed for headache. Reported on 11/18/2015    . ascorbic acid (VITAMIN C) 1000 MG tablet Take by mouth.    . Multiple Vitamin (MULTIVITAMIN) tablet Take 1 tablet by mouth daily.     No current facility-administered medications for this encounter.    Physical Findings: The patient is in no acute distress. Patient is alert and oriented.  weight is 124 lb 6.4 oz (56.4 kg). Her temperature is 97 F (36.1 C) (abnormal). Her blood pressure is 119/67 and her pulse is 73. Her respiration is 20 and oxygen saturation is 100%.   Lungs are clear to auscultation bilaterally. Heart has regular rate and rhythm. No palpable cervical, supraclavicular, or axillary adenopathy. Abdomen soft, non-tender,  normal bowel sounds.  On pelvic examination the external genitalia were unremarkable. A speculum exam was performed. There are no mucosal lesions noted in the vaginal vault.  Vaginal vault is somewhat narrowed and shortened. on bimanual examination there were no pelvic masses appreciated. Posterior vaginal wall is thickened, but soft and mobile, not consistent with recurrence. This thickening was noted by Dr. Denman George on her  exam approximately 6 months ago.  Lab Findings: Lab Results  Component Value Date   WBC 3.3 (L) 07/11/2017   HGB 14.1 07/11/2017   HCT 42.2 07/11/2017   MCV 87.6 07/11/2017   PLT 207 07/11/2017    Radiographic Findings: No results found.  Impression:  Recurrent Stage IA, Grade 2 endometrioid endometrial cancer s/p salvage radiation therapy.  No evidence of recurrence on clinical exam.  She does not appear to have any long-term complications from her pelvic radiation therapy and vaginal brachytherapy.  Plan:  She will see Dr. Denman George in 6 months. I will see her in a year.  ____________________________________  Blair Promise, PhD, MD  This document serves as a record of services personally performed by Gery Pray, MD. It was created on his behalf by Wilburn Mylar, a trained medical scribe. The creation of this record is based on the scribe's personal observations and the provider's statements to them. This document has been checked and approved by the attending provider.

## 2019-10-22 NOTE — Progress Notes (Signed)
Pt presents today for f/u with Dr. Sondra Come. Pt denies c/o pain. Pt denies dysuria/hematuria. Pt denies vaginal bleeding/discharge. Pt denies rectal bleeding, diarrhea/constipation. Pt denies abdominal bloating, N/V.  BP 119/67 (BP Location: Right Arm, Patient Position: Sitting, Cuff Size: Normal)   Pulse 73   Temp (!) 97 F (36.1 C)   Resp 20   Wt 124 lb 6.4 oz (56.4 kg)   SpO2 100%   BMI 21.35 kg/m   Wt Readings from Last 3 Encounters:  10/22/19 124 lb 6.4 oz (56.4 kg)  03/25/19 123 lb (55.8 kg)  01/22/19 120 lb 6 oz (54.6 kg)   Loma Sousa, RN BSN

## 2020-04-11 ENCOUNTER — Encounter: Payer: Self-pay | Admitting: Gynecologic Oncology

## 2020-04-11 ENCOUNTER — Inpatient Hospital Stay: Payer: BC Managed Care – PPO | Attending: Gynecologic Oncology | Admitting: Gynecologic Oncology

## 2020-04-11 ENCOUNTER — Other Ambulatory Visit: Payer: Self-pay

## 2020-04-11 VITALS — BP 122/69 | HR 68 | Temp 98.2°F | Resp 16 | Ht 64.0 in | Wt 119.2 lb

## 2020-04-11 DIAGNOSIS — N362 Urethral caruncle: Secondary | ICD-10-CM | POA: Diagnosis not present

## 2020-04-11 DIAGNOSIS — C541 Malignant neoplasm of endometrium: Secondary | ICD-10-CM | POA: Diagnosis present

## 2020-04-11 DIAGNOSIS — Z90722 Acquired absence of ovaries, bilateral: Secondary | ICD-10-CM | POA: Diagnosis not present

## 2020-04-11 DIAGNOSIS — Z923 Personal history of irradiation: Secondary | ICD-10-CM | POA: Insufficient documentation

## 2020-04-11 DIAGNOSIS — Z9079 Acquired absence of other genital organ(s): Secondary | ICD-10-CM | POA: Insufficient documentation

## 2020-04-11 DIAGNOSIS — R911 Solitary pulmonary nodule: Secondary | ICD-10-CM | POA: Diagnosis not present

## 2020-04-11 DIAGNOSIS — Z90710 Acquired absence of both cervix and uterus: Secondary | ICD-10-CM | POA: Diagnosis not present

## 2020-04-11 NOTE — Progress Notes (Signed)
ENDOMETRIAL CANCER FOLLOW-UP  Assessment:    64 y.o. 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   Complete clinical response on exam  Urethral caruncle: asymptomatic  Plan: She will see Dr Sondra Come in 6 months and myself in 12 months.   HPI:  Erin Warner is a 64 y.o. year old [id number] initially seen in consultation on 11/18/15 referred by Dr Hulan Fray for grade 2 endometrial cancer.  She then underwent a robotic assisted total hysterectomy, BSO, SLN biopsy on 05/26/41 without complications.  Her postoperative course was uncomplicated.  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 Given the low risk features on pathology, no adjuvant therapy was recommended in accordance with NCCN guidelines.  She was seen by Dr Hulan Fray on 09/28/16 for routine follow-up of her low risk endometrial cancer. 2 5-28mm nodules were seen at the right aspect of the vaginal cuff. The areas were entirely resected (grossly) with the biopsy forcep. Final pathology confirme recurrence of endometrioid adenocarcinoma, consistent with recurrent endometrial cancer.  On 116/18 she had restaging CT of chest, abdomen, pelvis. This showed no evidence of metastatic disease in the abdomen or pelvis and no adenopathy. No discrete mass was seen in the vagina. A solitary 74mm LUL pulmonary nodule was identified in the chest, favored benign.  She received salvage radiation with external beam radiation to the pelvis 45 Gy in 25 fractions and 24 Gy to the vaginal cuff in 4 fractions between 11/05/16 - 01/03/17.   Interval Hx: She has no symptoms of bleeding, discomfort, difficulty with urination.  She has no bleeding.  Today we reviewed survivorship issues including sexual function.   Current Outpatient Medications on File Prior to Visit   Medication Sig Dispense Refill  . acetaminophen (TYLENOL) 500 MG tablet Take 500 mg by mouth every 6 (six) hours as needed for headache. Reported on 11/18/2015    . ascorbic acid (VITAMIN C) 1000 MG tablet Take by mouth.    . Multiple Vitamin (MULTIVITAMIN) tablet Take 1 tablet by mouth daily.     No current facility-administered medications on file prior to visit.   No Known Allergies Past Medical History:  Diagnosis Date  . Cancer ([place])    basal cell, dx.2'17  cervical cancer- surgery planned.  Marland Kitchen History of radiation therapy 11/05/2016 - 12/07/2016   IMRT Pelvis 45 Gy 25 fractions  . History of radiation therapy 12/13/2016, 12/20/2016, 12/27/2016, 01/03/2017   Vaginal Cuff HDR 24 Gy 4 fractions   Past Surgical History:  Procedure Laterality Date  . basal cell removal  2016   back  . BUNIONECTOMY  2009  . ROBOTIC ASSISTED TOTAL HYSTERECTOMY N/A 12/01/2015   Procedure: XI ROBOTIC ASSISTED TOTAL HYSTERECTOMY, BILATERAL SALPINGECTOMY OOPHORECTOMY WITH SENTINAL LYMPH NODE BIOPSY;  Surgeon: [name], MD;  Location: [facility];  Service: Gynecology;  Laterality: N/A;   Family History  Problem Relation Age of Onset  . Cancer Mother 81       Lung  . Heart disease Father    Social History   Socioeconomic History  . Marital status: Widowed    Spouse name: Not on file  . Number of children: 4  . Years of education: Not on file  . Highest education level: Not on file  Occupational History  . Not on file  Tobacco Use  . Smoking status:  Never Smoker  . Smokeless tobacco: Never Used  Vaping Use  . Vaping Use: Never used  Substance and Sexual Activity  . Alcohol use: No  . Drug use: No  . Sexual activity: Not Currently    Birth control/protection: Post-menopausal  Other Topics Concern  . Not on file  Social History Narrative  . Not on file   Social Determinants of Health   Financial Resource Strain:   . Difficulty of Paying Living Expenses:   Food Insecurity:   . Worried  About Charity fundraiser in the Last Year:   . Arboriculturist in the Last Year:   Transportation Needs:   . Film/video editor (Medical):   Marland Kitchen Lack of Transportation (Non-Medical):   Physical Activity:   . Days of Exercise per Week:   . Minutes of Exercise per Session:   Stress:   . Feeling of Stress :   Social Connections:   . Frequency of Communication with Friends and Family:   . Frequency of Social Gatherings with Friends and Family:   . Attends Religious Services:   . Active Member of Clubs or Organizations:   . Attends Archivist Meetings:   Marland Kitchen Marital Status:   Intimate Partner Violence:   . Fear of Current or Ex-Partner:   . Emotionally Abused:   Marland Kitchen Physically Abused:   . Sexually Abused:     Review of systems: Constitutional:  She has no weight gain or weight loss. She has no fever or chills. Eyes: No blurred vision Ears, Nose, Mouth, Throat: No dizziness, headaches or changes in hearing. No mouth sores. Cardiovascular: No chest pain, palpitations or edema. Respiratory:  No shortness of breath, wheezing or cough Gastrointestinal: She has normal bowel movements without diarrhea or constipation. She denies any nausea or vomiting. She denies blood in her stool or heart burn. Genitourinary:  She denies pelvic pain, pelvic pressure or changes in her urinary function. She has no hematuria, dysuria, or incontinence. She has no irregular vaginal bleeding or vaginal discharge Musculoskeletal: Denies muscle weakness or joint pains.  Skin:  She has no skin changes, rashes or itching Neurological:  Denies dizziness or headaches. No neuropathy, no numbness or tingling. Psychiatric:  She denies depression or anxiety. Hematologic/Lymphatic:   No easy bruising or bleeding   Physical Exam: Blood pressure 122/69, pulse 68, temperature 98.2 F (36.8 C), temperature source Oral, resp. rate 16, height 5\' 4"  (1.626 m), weight 119 lb 3.2 oz (54.1 kg), SpO2 100 %. General:  Well dressed, well nourished in no apparent distress.   HEENT:  Normocephalic and atraumatic, no lesions.  Extraocular muscles intact. Sclerae anicteric. Pupils equal, round, reactive. No mouth sores or ulcers. Thyroid is normal size, not nodular, midline. Lungs:  Clear to auscultation bilaterally.  No wheezes. Cardiovascular:  Regular rate and rhythm.  No murmurs or rubs. Abdomen:  Soft, nontender, nondistended.  No palpable masses.  No hepatosplenomegaly.  No ascites. Normal bowel sounds.  No hernias.  Incisions are well healed. Genitourinary: upper vagina agglutinated, no lesions in vagina, no palpable masses. Shortened vagina. Posterior vaginal wall is thickened, but soft and mobile and not consistent with recurrence. Slight cording of the distal right introitus.  Extremities: No cyanosis, clubbing or edema.  No calf tenderness or erythema. No palpable cords. Psychiatric: Mood and affect are appropriate. Neurological: Awake, alert and oriented x 3. Sensation is intact, no neuropathy.  Musculoskeletal: No pain, normal strength and range of motion.   Lenard Simmer  Denman George, MD

## 2020-04-11 NOTE — Patient Instructions (Signed)
Please notify Dr Denman George at phone number [phone] if you notice vaginal bleeding, new pelvic or abdominal pains, bloating, feeling full easy, or a change in bladder or bowel function.   Please contact Dr Serita Grit office (at [phone]) in April, 2022 to request an appointment with her for July, 2022.

## 2020-10-23 NOTE — Progress Notes (Signed)
Radiation Oncology         [phone]) [phone] ________________________________  Name: Erin Warner MRN: [id number]  Date: 10/24/2020  DOB: 12/20/1955  Follow-Up Visit Note  CC: System, Provider Not In  [name], MD    ICD-10-CM   1. 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 Endometrial cancer (HCC)  C54.1     Diagnosis: Recurrent Stage IA, Grade 2 endometrioid endometrial cancer  Interval Since Last Radiation: Three years, nine months, two weeks, and five days  11/05/16 - 12/07/16: Pelvis / 45 Gy in 25 fractions (IMRT) 3/22, 3/29, 4/5, 01/03/17: Vaginal Cuff treated to 24 Gy in 4 fractions  Narrative:  The patient returns today for routine follow-up. She was last seen by Dr. Denman Warner on 04/11/2020, during which time there was no evidence of recurrence.  On review of systems, she reports no new medical issues since her last follow-up. She denies vaginal bleeding pelvic pain or abdominal bloating.  She denies any hematuria or rectal bleeding.  Patient has lost some weight secondary to having braces in limitations on eating.  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  ALLERGIES:  has No Known Allergies.  Meds: Current Outpatient Medications  Medication Sig Dispense Refill   acetaminophen (TYLENOL) 500 MG tablet Take 500 mg by mouth every 6 (six) hours as needed for headache. Reported on 11/18/2015     ascorbic acid (VITAMIN C) 1000 MG tablet Take by mouth.     Multiple Vitamin (MULTIVITAMIN) tablet Take 1 tablet by mouth daily.     No current facility-administered medications for this encounter.    Physical Findings: The patient is in no acute distress. Patient is alert and oriented.  height is 5\' 4"  (1.626 m) and weight is 50 kg. Her temporal temperature is 96.8 F (36 C) (abnormal). Her blood pressure is 115/72 and her pulse is 66. Her respiration is 18 and oxygen saturation is 100%.   Lungs  are clear to auscultation bilaterally. Heart has regular rate and rhythm. No palpable cervical, supraclavicular, or axillary adenopathy. Abdomen soft, non-tender, normal bowel sounds.  On pelvic examination the external genitalia were unremarkable. A speculum exam was performed. There are no mucosal lesions noted in the vaginal vault.  Vaginal vault is somewhat narrowed and shortened. On bimanual examination there were no pelvic masses appreciated. Posterior vaginal wall is thickened, but soft and mobile, not consistent with recurrence. This thickening was also noted by Dr. Denman Warner on her exam approximately 6 months ago.   Lab Findings: Lab Results  Component Value Date   WBC 3.3 (L) 07/11/2017   HGB 14.1 07/11/2017   HCT 42.2 07/11/2017   MCV 87.6 07/11/2017   PLT 207 07/11/2017    Radiographic Findings: No results found.  Impression:  Recurrent Stage IA, Grade 2 endometrioid endometrial cancer s/p salvage radiation therapy.  No evidence of recurrence on clinical exam.  She does not appear to have any long-term complications from her pelvic radiation therapy and vaginal brachytherapy.  Plan: The patient will follow-up with Dr. Denman Warner in six months and with radiation oncology in one year.  That appointment will likely be her last follow-up in radiation oncology since she will be close to 5 years out from her treatment.  Total time spent in this encounter was 20 minutes which included reviewing the patient's most recent follow-up with Dr. Denman Warner, physical examination, and documentation.  ____________________________________  Blair Promise, PhD, MD  This document serves as a record of services personally performed by Gery Pray, MD. It was created on his behalf by Clerance Lav, a trained medical scribe. The creation of this record is based on the scribe's personal observations and the provider's statements to them. This document has been checked and approved by the attending provider.

## 2020-10-24 ENCOUNTER — Ambulatory Visit
Admission: RE | Admit: 2020-10-24 | Discharge: 2020-10-24 | Disposition: A | Discharge status: Home / Self Care | Payer: BC Managed Care – PPO | Source: Ambulatory Visit | Attending: Radiation Oncology | Admitting: Radiation Oncology

## 2020-10-24 ENCOUNTER — Encounter: Payer: Self-pay | Admitting: Radiation Oncology

## 2020-10-24 ENCOUNTER — Other Ambulatory Visit: Payer: Self-pay

## 2020-10-24 VITALS — BP 115/72 | HR 66 | Temp 96.8°F | Resp 18 | Ht 64.0 in | Wt 110.2 lb

## 2020-10-24 DIAGNOSIS — C7982 Secondary malignant neoplasm of genital organs: Secondary | ICD-10-CM

## 2020-10-24 DIAGNOSIS — Z8542 Personal history of malignant neoplasm of other parts of uterus: Secondary | ICD-10-CM | POA: Diagnosis not present

## 2020-10-24 DIAGNOSIS — C541 Malignant neoplasm of endometrium: Secondary | ICD-10-CM

## 2020-10-24 DIAGNOSIS — Z79899 Other long term (current) drug therapy: Secondary | ICD-10-CM | POA: Diagnosis not present

## 2020-10-24 DIAGNOSIS — Z923 Personal history of irradiation: Secondary | ICD-10-CM | POA: Diagnosis not present

## 2020-10-24 NOTE — Progress Notes (Signed)
Patient is here today for follow up to endometrial treatment that was completed in 2018.  Patient denies having any pain. Denies having any diarrhea or constipation.  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  Denies having any urinary issues/or vaginal bleeding.  Denies using the vaginal dilator and also is not sexually active.  Vitals:   10/24/20 1025  BP: 115/72  Pulse: 66  Resp: 18  Temp: (!) 96.8 F (36 C)  TempSrc: Temporal  SpO2: 100%  Weight: 110 lb 4 oz (50 kg)  Height: 5\' 4"  (1.626 m)

## 2021-02-15 ENCOUNTER — Telehealth: Payer: Self-pay | Admitting: *Deleted

## 2021-02-15 NOTE — Telephone Encounter (Signed)
Enid Derry from radiation called and scheduled a follow up appt for July. Enid Derry will contact the patient

## 2021-02-15 NOTE — Telephone Encounter (Signed)
CALLED PATIENT TO INFORM OF FU APPT. WITH DR. Denman George ON 03-28-21- ARRIVAL TIME- 1:30 PM, LVM FOR A RETURN CALL

## 2021-03-23 ENCOUNTER — Encounter: Payer: Self-pay | Admitting: Gynecologic Oncology

## 2021-03-28 ENCOUNTER — Encounter: Payer: Self-pay | Admitting: Gynecologic Oncology

## 2021-03-28 ENCOUNTER — Inpatient Hospital Stay: Payer: Medicare Other | Attending: Gynecologic Oncology | Admitting: Gynecologic Oncology

## 2021-03-28 ENCOUNTER — Other Ambulatory Visit: Payer: Self-pay

## 2021-03-28 VITALS — BP 112/71 | HR 70 | Temp 97.5°F | Resp 16 | Ht 64.0 in | Wt 107.6 lb

## 2021-03-28 DIAGNOSIS — Z8542 Personal history of malignant neoplasm of other parts of uterus: Secondary | ICD-10-CM | POA: Insufficient documentation

## 2021-03-28 DIAGNOSIS — Z90710 Acquired absence of both cervix and uterus: Secondary | ICD-10-CM | POA: Diagnosis not present

## 2021-03-28 DIAGNOSIS — N362 Urethral caruncle: Secondary | ICD-10-CM | POA: Insufficient documentation

## 2021-03-28 DIAGNOSIS — Z90722 Acquired absence of ovaries, bilateral: Secondary | ICD-10-CM | POA: Insufficient documentation

## 2021-03-28 DIAGNOSIS — Z923 Personal history of irradiation: Secondary | ICD-10-CM | POA: Diagnosis not present

## 2021-03-28 DIAGNOSIS — C541 Malignant neoplasm of endometrium: Secondary | ICD-10-CM

## 2021-03-28 NOTE — Progress Notes (Signed)
ENDOMETRIAL CANCER FOLLOW-UP  Assessment:    65 y.o. 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   Complete clinical response on exam  Urethral caruncle: asymptomatic  Plan: She will see Dr Sondra Come in 6 months. After that time she will have completed surveillance care and will no longer require scheduled surveillance visits.  I informed Ms Head of my planned departure and she will follow-up with Dr Berline Lopes on a prn basis if necessary.   HPI:  Erin Warner is a 65 y.o. year old [id number] initially seen in consultation on 11/18/15 referred by Dr Hulan Fray for grade 2 endometrial cancer.  She then underwent a robotic assisted total hysterectomy, BSO, SLN biopsy on 05/31/01 without complications.  Her postoperative course was uncomplicated.  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 Given the low risk features on pathology, no adjuvant therapy was recommended in accordance with NCCN guidelines.  She was seen by Dr Hulan Fray on 09/28/16 for routine follow-up of her low risk endometrial cancer. 2 5-55mm nodules were seen at the right aspect of the vaginal cuff. The areas were entirely resected (grossly) with the biopsy forcep. Final pathology confirme recurrence of endometrioid adenocarcinoma, consistent with recurrent endometrial cancer.  On 116/18 she had restaging CT of chest, abdomen, pelvis. This showed no evidence of metastatic disease in the abdomen or pelvis and no adenopathy. No discrete mass was seen in the vagina. A solitary 59mm LUL pulmonary nodule was identified in the chest, favored benign.  She received salvage radiation with external beam radiation to the pelvis 45 Gy in 25 fractions and 24 Gy to the vaginal cuff in 4 fractions between 11/05/16 - 01/03/17.   Interval Hx: She has no  symptoms of bleeding, discomfort, difficulty with urination.  She has no bleeding.    Current Outpatient Medications on File Prior to Visit  Medication Sig Dispense Refill   Multiple Vitamin (MULTIVITAMIN) tablet Take 1 tablet by mouth daily.     acetaminophen (TYLENOL) 500 MG tablet Take 500 mg by mouth every 6 (six) hours as needed for headache. Reported on 11/18/2015 (Patient not taking: Reported on 03/23/2021)     ascorbic acid (VITAMIN C) 1000 MG tablet Take by mouth. (Patient not taking: Reported on 03/23/2021)     No current facility-administered medications on file prior to visit.   No Known Allergies Past Medical History:  Diagnosis Date   Cancer ([place])    basal cell, dx.2'17  cervical cancer- surgery planned.   History of radiation therapy 11/05/2016 - 12/07/2016   IMRT Pelvis 45 Gy 25 fractions   History of radiation therapy 12/13/2016, 12/20/2016, 12/27/2016, 01/03/2017   Vaginal Cuff HDR 24 Gy 4 fractions   Past Surgical History:  Procedure Laterality Date   basal cell removal  2016   back   BUNIONECTOMY  2009   ROBOTIC ASSISTED TOTAL HYSTERECTOMY N/A 12/01/2015   Procedure: XI ROBOTIC ASSISTED TOTAL HYSTERECTOMY, BILATERAL SALPINGECTOMY OOPHORECTOMY WITH SENTINAL LYMPH NODE BIOPSY;  Surgeon: [name], MD;  Location: [facility];  Service: Gynecology;  Laterality: N/A;   Family History  Problem Relation Age of Onset   Cancer Mother 38       Lung   Heart disease Father    Social History   Socioeconomic History   Marital status: Widowed    Spouse name: Not on  file   Number of children: 4   Years of education: Not on file   Highest education level: Not on file  Occupational History   Not on file  Tobacco Use   Smoking status: Never   Smokeless tobacco: Never  Vaping Use   Vaping Use: Never used  Substance and Sexual Activity   Alcohol use: No   Drug use: No   Sexual activity: Not Currently    Birth control/protection: Post-menopausal  Other Topics Concern    Not on file  Social History Narrative   Not on file   Social Determinants of Health   Financial Resource Strain: Not on file  Food Insecurity: Not on file  Transportation Needs: Not on file  Physical Activity: Not on file  Stress: Not on file  Social Connections: Not on file  Intimate Partner Violence: Not on file    Review of systems: Constitutional:  She has no weight gain or weight loss. She has no fever or chills. Eyes: No blurred vision Ears, Nose, Mouth, Throat: No dizziness, headaches or changes in hearing. No mouth sores. Cardiovascular: No chest pain, palpitations or edema. Respiratory:  No shortness of breath, wheezing or cough Gastrointestinal: She has normal bowel movements without diarrhea or constipation. She denies any nausea or vomiting. She denies blood in her stool or heart burn. Genitourinary:  She denies pelvic pain, pelvic pressure or changes in her urinary function. She has no hematuria, dysuria, or incontinence. She has no irregular vaginal bleeding or vaginal discharge Musculoskeletal: Denies muscle weakness or joint pains.  Skin:  She has no skin changes, rashes or itching Neurological:  Denies dizziness or headaches. No neuropathy, no numbness or tingling. Psychiatric:  She denies depression or anxiety. Hematologic/Lymphatic:   No easy bruising or bleeding   Physical Exam: Blood pressure 112/71, pulse 70, temperature (!) 97.5 F (36.4 C), temperature source Tympanic, resp. rate 16, height 5\' 4"  (1.626 m), weight 107 lb 9.6 oz (48.8 kg), SpO2 100 %. General: Well dressed, well nourished in no apparent distress.   HEENT:  Normocephalic and atraumatic, no lesions.  Extraocular muscles intact. Sclerae anicteric. Pupils equal, round, reactive. No mouth sores or ulcers. Thyroid is normal size, not nodular, midline. Lungs:  Clear to auscultation bilaterally.  No wheezes. Cardiovascular:  Regular rate and rhythm.  No murmurs or rubs. Abdomen:  Soft, nontender,  nondistended.  No palpable masses.  No hepatosplenomegaly.  No ascites. Normal bowel sounds.  No hernias.  Incisions are well healed. Genitourinary: upper vagina agglutinated, no lesions in vagina, no palpable masses. Shortened vagina. Posterior vaginal wall is thickened, but soft and mobile and not consistent with recurrence. Slight cording of the distal right introitus.  Extremities: No cyanosis, clubbing or edema.  No calf tenderness or erythema. No palpable cords. Psychiatric: Mood and affect are appropriate. Neurological: Awake, alert and oriented x 3. Sensation is intact, no neuropathy.  Musculoskeletal: No pain, normal strength and range of motion.   Thereasa Solo, MD

## 2021-03-28 NOTE — Patient Instructions (Signed)
Dr Denman George recommends follow-up with Dr Sondra Come in January, 2023, after which you will have graduated from scheduled cancer surveillance visits.  However, please contact Dr Serita Grit office at [phone] if you have new concerns.

## 2021-10-19 ENCOUNTER — Telehealth: Payer: Self-pay | Admitting: *Deleted

## 2021-10-19 NOTE — Telephone Encounter (Signed)
CALLED PATIENT TO ALTER FU APPT. ON 10-23-21 DUE TO DR. KINARD BEING IN THE OR, RESCHEDULED FOR 11-09-21 @ 8:30 AM, LVM FOR A RETURN CALL

## 2021-10-23 ENCOUNTER — Telehealth: Payer: Self-pay | Admitting: *Deleted

## 2021-10-23 ENCOUNTER — Ambulatory Visit: Payer: Medicare Other | Admitting: Radiation Oncology

## 2021-10-23 NOTE — Telephone Encounter (Signed)
RETURNED PATIENT'S PHONE CALL, LVM FOR A RETURN CALL 

## 2021-11-09 ENCOUNTER — Ambulatory Visit: Payer: Medicare Other | Admitting: Radiation Oncology

## 2021-11-10 NOTE — Progress Notes (Signed)
Erin Warner is here today for follow up post radiation to the pelvis.  They completed their radiation on: 01/03/2017   Does the patient complain of any of the following:  Pain: No Abdominal bloating: No Diarrhea/Constipation: No Nausea/Vomiting: No Vaginal Discharge: No Blood in Urine or Stool: No Urinary Issues (dysuria/incomplete emptying/ incontinence/ increased frequency/urgency): No Does patient report using vaginal dilator 2-3 times a week and/or sexually active 2-3 weeks: No.  Post radiation skin changes: no   Additional comments if applicable:    Vitals:   11/13/21 1501  BP: 126/66  Pulse: 70  Resp: 18  Temp: (!) 97.4 F (36.3 C)  SpO2: 100%  Weight: 113 lb (51.3 kg)  Height: 5\' 4"  (1.626 m)

## 2021-11-12 NOTE — Progress Notes (Signed)
Radiation Oncology         [phone]) [phone] ________________________________  Name: Erin Warner MRN: [id number]  Date: 11/13/2021  DOB: May 26, 1956  Follow-Up Visit Note  CC: System, Provider Not In  [name], MD    ICD-10-CM   1. Endometrial cancer ([place])  C54.1       Diagnosis: Recurrent Stage IA, Grade 2 endometrioid endometrial cancer   Interval Since Last Radiation:  4 years, 10 months, and 8 days   11/05/16 - 12/07/16: Pelvis / 45 Gy in 25 fractions (IMRT) 3/22, 3/29, 4/5, 01/03/17: Vaginal Cuff treated to 24 Gy in 4 fractions  Narrative:  The patient returns today for routine annual follow-up (she was last seen here for follow up on 10/24/20). Since her last visit, the patient followed up with Dr. Denman George on 03/28/21. During which time, the patient reported no symptoms concerning for disease recurrence and was noted as NED on examination. Pelvic exam performed did reveal the posterior vaginal wall as thickened but soft and mobile and not consistent with recurrence. Slight cording of the distal right introitus was also noted.   Otherwise, no significant interval history since the patient was last seen.   She denies any new medical issues since her last follow-up.  She denies any new prescription medications.  On evaluation today she denies any abdominal bloating, pelvic pain,  vaginal bleeding or discharge.  She denies any hematuria or rectal bleeding.                               Allergies:  has No Known Allergies.  Meds: Current Outpatient Medications  Medication Sig Dispense Refill   acetaminophen (TYLENOL) 500 MG tablet Take 500 mg by mouth every 6 (six) hours as needed for headache. Reported on 11/18/2015     ascorbic acid (VITAMIN C) 1000 MG tablet Take by mouth.     Multiple Vitamin (MULTIVITAMIN) tablet Take 1 tablet by mouth daily.     No current facility-administered medications for this encounter.    Physical Findings: The patient is in no acute distress.  Patient is alert and oriented.  height is 5\' 4"  (1.626 m) and weight is 113 lb (51.3 kg). Her temperature is 97.4 F (36.3 C) (abnormal). Her blood pressure is 126/66 and her pulse is 70. Her respiration is 18 and oxygen saturation is 100%. .  No significant changes. Lungs are clear to auscultation bilaterally. Heart has regular rate and rhythm. No palpable cervical, supraclavicular, or axillary adenopathy. Abdomen soft, non-tender, normal bowel sounds.   On pelvic examination the external genitalia were unremarkable. A speculum exam was performed. There are no mucosal lesions noted in the vaginal vault.  Some thickening noted to the posterior wall of the vagina which is mobile and soft and not consistent with recurrence.  This was also noted on Dr. Serita Grit exam approximately 6 months ago.  Bimanual and rectovaginal examination reveals no suspicious masses.  Rectal sphincter tone good.  Vaginal cuff intact.   Lab Findings: Lab Results  Component Value Date   WBC 3.3 (L) 07/11/2017   HGB 14.1 07/11/2017   HCT 42.2 07/11/2017   MCV 87.6 07/11/2017   PLT 207 07/11/2017    Radiographic Findings: No results found.  Impression:  Recurrent Stage IA, Grade 2 endometrioid endometrial cancer   No evidence of recurrence on clinical exam today.  I congratulated the patient on a successful 5-year follow-up without recurrence.   Plan:  As needed follow-up.  She will touch base with her gynecologist in [place] for yearly examinations.   20 minutes of total time was spent for this patient encounter, including preparation, face-to-face counseling with the patient and coordination of care, physical exam, and documentation of the encounter. ____________________________________  Blair Promise, PhD, MD   This document serves as a record of services personally performed by Gery Pray, MD. It was created on his behalf by Roney Mans, a trained medical scribe. The creation of this record is based on  the scribe's personal observations and the provider's statements to them. This document has been checked and approved by the attending provider.

## 2021-11-13 ENCOUNTER — Ambulatory Visit
Admission: RE | Admit: 2021-11-13 | Discharge: 2021-11-13 | Disposition: A | Discharge status: Home / Self Care | Payer: Medicare Other | Source: Ambulatory Visit | Attending: Radiation Oncology | Admitting: Radiation Oncology

## 2021-11-13 ENCOUNTER — Encounter: Payer: Self-pay | Admitting: Radiation Oncology

## 2021-11-13 ENCOUNTER — Other Ambulatory Visit: Payer: Self-pay

## 2021-11-13 DIAGNOSIS — Z08 Encounter for follow-up examination after completed treatment for malignant neoplasm: Secondary | ICD-10-CM | POA: Diagnosis not present

## 2021-11-13 DIAGNOSIS — Z923 Personal history of irradiation: Secondary | ICD-10-CM | POA: Diagnosis not present

## 2021-11-13 DIAGNOSIS — Z8542 Personal history of malignant neoplasm of other parts of uterus: Secondary | ICD-10-CM | POA: Diagnosis not present

## 2021-11-13 DIAGNOSIS — C541 Malignant neoplasm of endometrium: Secondary | ICD-10-CM

## 2022-01-01 DIAGNOSIS — B029 Zoster without complications: Secondary | ICD-10-CM | POA: Diagnosis not present

## 2022-07-25 DIAGNOSIS — S51802A Unspecified open wound of left forearm, initial encounter: Secondary | ICD-10-CM | POA: Diagnosis not present

## 2022-07-25 DIAGNOSIS — Z23 Encounter for immunization: Secondary | ICD-10-CM | POA: Diagnosis not present
